# Patient Record
Sex: Male | Born: 1965 | Race: White | Hispanic: No | Marital: Married | Smoking: Never smoker
Health system: Southern US, Community
[De-identification: ages and names within clinical notes are randomized; demographics above are authoritative.]

## PROBLEM LIST (undated history)

## (undated) DIAGNOSIS — G35 Multiple sclerosis: Secondary | ICD-10-CM

## (undated) DIAGNOSIS — H539 Unspecified visual disturbance: Secondary | ICD-10-CM

## (undated) DIAGNOSIS — F32A Depression, unspecified: Secondary | ICD-10-CM

## (undated) DIAGNOSIS — R519 Headache, unspecified: Secondary | ICD-10-CM

## (undated) DIAGNOSIS — R51 Headache: Secondary | ICD-10-CM

## (undated) DIAGNOSIS — H9190 Unspecified hearing loss, unspecified ear: Secondary | ICD-10-CM

## (undated) DIAGNOSIS — R413 Other amnesia: Secondary | ICD-10-CM

## (undated) DIAGNOSIS — F329 Major depressive disorder, single episode, unspecified: Secondary | ICD-10-CM

## (undated) HISTORY — DX: Unspecified hearing loss, unspecified ear: H91.90

## (undated) HISTORY — DX: Unspecified visual disturbance: H53.9

## (undated) HISTORY — PX: OTHER SURGICAL HISTORY: SHX169

## (undated) HISTORY — DX: Other amnesia: R41.3

## (undated) HISTORY — DX: Headache, unspecified: R51.9

## (undated) HISTORY — DX: Headache: R51

---

## 2011-06-22 DIAGNOSIS — G35 Multiple sclerosis: Secondary | ICD-10-CM | POA: Insufficient documentation

## 2011-07-24 DIAGNOSIS — H43819 Vitreous degeneration, unspecified eye: Secondary | ICD-10-CM | POA: Insufficient documentation

## 2011-07-31 DIAGNOSIS — G43909 Migraine, unspecified, not intractable, without status migrainosus: Secondary | ICD-10-CM | POA: Insufficient documentation

## 2012-05-06 DIAGNOSIS — M25559 Pain in unspecified hip: Secondary | ICD-10-CM | POA: Insufficient documentation

## 2014-01-04 DIAGNOSIS — Z8042 Family history of malignant neoplasm of prostate: Secondary | ICD-10-CM | POA: Insufficient documentation

## 2014-01-09 DIAGNOSIS — K5900 Constipation, unspecified: Secondary | ICD-10-CM | POA: Insufficient documentation

## 2014-01-09 DIAGNOSIS — R3911 Hesitancy of micturition: Secondary | ICD-10-CM | POA: Insufficient documentation

## 2014-01-09 DIAGNOSIS — R350 Frequency of micturition: Secondary | ICD-10-CM | POA: Insufficient documentation

## 2014-03-14 DIAGNOSIS — N3281 Overactive bladder: Secondary | ICD-10-CM | POA: Insufficient documentation

## 2014-03-14 DIAGNOSIS — N3644 Muscular disorders of urethra: Secondary | ICD-10-CM | POA: Insufficient documentation

## 2015-04-12 DIAGNOSIS — G35 Multiple sclerosis: Secondary | ICD-10-CM | POA: Insufficient documentation

## 2015-04-12 DIAGNOSIS — F330 Major depressive disorder, recurrent, mild: Secondary | ICD-10-CM | POA: Insufficient documentation

## 2015-04-25 ENCOUNTER — Other Ambulatory Visit: Payer: Self-pay | Admitting: Neurology

## 2015-04-25 DIAGNOSIS — G35 Multiple sclerosis: Secondary | ICD-10-CM

## 2015-05-02 ENCOUNTER — Ambulatory Visit: Payer: 59

## 2015-05-02 ENCOUNTER — Ambulatory Visit
Admission: RE | Admit: 2015-05-02 | Discharge: 2015-05-02 | Disposition: A | Discharge status: Home / Self Care | Payer: 59 | Source: Ambulatory Visit | Attending: Neurology | Admitting: Neurology

## 2015-05-02 DIAGNOSIS — G35 Multiple sclerosis: Secondary | ICD-10-CM | POA: Diagnosis present

## 2015-05-02 DIAGNOSIS — M47892 Other spondylosis, cervical region: Secondary | ICD-10-CM | POA: Diagnosis not present

## 2015-05-02 DIAGNOSIS — M5032 Other cervical disc degeneration, mid-cervical region: Secondary | ICD-10-CM | POA: Diagnosis not present

## 2015-05-02 MED ORDER — GADOBENATE DIMEGLUMINE 529 MG/ML IV SOLN
20.0000 mL | Freq: Once | INTRAVENOUS | Status: AC | PRN
  Administered 2015-05-02: 20 mL via INTRAVENOUS

## 2015-06-28 ENCOUNTER — Encounter: Payer: Self-pay | Admitting: *Deleted

## 2015-07-01 ENCOUNTER — Encounter
Admission: RE | Disposition: A | Discharge status: Home / Self Care | Payer: Self-pay | Source: Ambulatory Visit | Attending: Gastroenterology

## 2015-07-01 ENCOUNTER — Encounter: Payer: Self-pay | Admitting: *Deleted

## 2015-07-01 ENCOUNTER — Ambulatory Visit
Admission: RE | Admit: 2015-07-01 | Discharge: 2015-07-01 | Disposition: A | Discharge status: Home / Self Care | Payer: 59 | Source: Ambulatory Visit | Attending: Gastroenterology | Admitting: Gastroenterology

## 2015-07-01 ENCOUNTER — Ambulatory Visit: Payer: 59 | Admitting: *Deleted

## 2015-07-01 DIAGNOSIS — K635 Polyp of colon: Secondary | ICD-10-CM | POA: Insufficient documentation

## 2015-07-01 DIAGNOSIS — Z79899 Other long term (current) drug therapy: Secondary | ICD-10-CM | POA: Diagnosis not present

## 2015-07-01 DIAGNOSIS — G35 Multiple sclerosis: Secondary | ICD-10-CM | POA: Diagnosis not present

## 2015-07-01 DIAGNOSIS — Z1211 Encounter for screening for malignant neoplasm of colon: Secondary | ICD-10-CM | POA: Insufficient documentation

## 2015-07-01 DIAGNOSIS — Z87891 Personal history of nicotine dependence: Secondary | ICD-10-CM | POA: Diagnosis not present

## 2015-07-01 DIAGNOSIS — Z882 Allergy status to sulfonamides status: Secondary | ICD-10-CM | POA: Diagnosis not present

## 2015-07-01 DIAGNOSIS — F329 Major depressive disorder, single episode, unspecified: Secondary | ICD-10-CM | POA: Diagnosis not present

## 2015-07-01 DIAGNOSIS — Z888 Allergy status to other drugs, medicaments and biological substances status: Secondary | ICD-10-CM | POA: Diagnosis not present

## 2015-07-01 DIAGNOSIS — Z880 Allergy status to penicillin: Secondary | ICD-10-CM | POA: Insufficient documentation

## 2015-07-01 HISTORY — DX: Major depressive disorder, single episode, unspecified: F32.9

## 2015-07-01 HISTORY — DX: Multiple sclerosis: G35

## 2015-07-01 HISTORY — PX: COLONOSCOPY WITH PROPOFOL: SHX5780

## 2015-07-01 HISTORY — DX: Depression, unspecified: F32.A

## 2015-07-01 SURGERY — COLONOSCOPY WITH PROPOFOL
Anesthesia: General

## 2015-07-01 MED ORDER — LACTATED RINGERS IV SOLN
INTRAVENOUS | Status: DC | PRN
  Administered 2015-07-01: 08:00:00 via INTRAVENOUS

## 2015-07-01 MED ORDER — SODIUM CHLORIDE 0.9 % IV SOLN
INTRAVENOUS | Status: DC
Start: 2015-07-01 — End: 2015-07-01
  Administered 2015-07-01: 1000 mL via INTRAVENOUS

## 2015-07-01 MED ORDER — MIDAZOLAM HCL 5 MG/5ML IJ SOLN
INTRAMUSCULAR | Status: DC | PRN
  Administered 2015-07-01: 2 mg via INTRAVENOUS

## 2015-07-01 MED ORDER — SODIUM CHLORIDE 0.9 % IV SOLN
INTRAVENOUS | Status: DC
Start: 2015-07-01 — End: 2015-07-01

## 2015-07-01 MED ORDER — FENTANYL CITRATE (PF) 100 MCG/2ML IJ SOLN
INTRAMUSCULAR | Status: DC | PRN
  Administered 2015-07-01: 50 ug via INTRAVENOUS

## 2015-07-01 MED ORDER — PROPOFOL 500 MG/50ML IV EMUL
INTRAVENOUS | Status: DC | PRN
  Administered 2015-07-01: 140 ug/kg/min via INTRAVENOUS

## 2015-07-01 MED ORDER — PROPOFOL 10 MG/ML IV BOLUS
INTRAVENOUS | Status: DC | PRN
  Administered 2015-07-01: 60 mg via INTRAVENOUS

## 2015-07-01 NOTE — Op Note (Signed)
[facility] Gastroenterology Patient Name: Parker [name] Procedure Date: 07/01/2015 7:36 AM MRN: [id number] Account #: [id number] Date of Birth: [birth year] Admit Type: Outpatient Age: 49 Room: [facility] ENDO ROOM 3 Gender: Male Note Status: Finalized Procedure:         Colonoscopy Indications:       Screening for colorectal malignant neoplasm, This is the                     patient's first colonoscopy Providers:         Christena Deem, MD Referring MD:      Haynes Kerns (Referring MD) Medicines:         Monitored Anesthesia Care Complications:     No immediate complications. Procedure:         Pre-Anesthesia Assessment:                    - ASA Grade Assessment: II - A patient with mild systemic                     disease.                    After obtaining informed consent, the colonoscope was                     passed under direct vision. Throughout the procedure, the                     patient's blood pressure, pulse, and oxygen saturations                     were monitored continuously. The Olympus PCF-H180AL                     colonoscope ( S#: [id number] ) was introduced through the                     anus and advanced to the the cecum, identified by                     appendiceal orifice and ileocecal valve. The quality of                     the bowel preparation was good. Findings:      A 3 mm polyp was found in the cecum. The polyp was sessile. The polyp       was removed with a cold biopsy forceps. Resection and retrieval were       complete.      Two sessile polyps were found in the recto-sigmoid colon. The polyps       were 2 to 3 mm in size. These polyps were removed with a cold biopsy       forceps. Resection and retrieval were complete.      The retroflexed view of the distal rectum and anal verge was normal and       showed no anal or rectal abnormalities.      The exam was otherwise normal throughout the examined colon. Impression:         - One 3 mm polyp in the cecum. Resected and retrieved.                    - Two 2 to 3 mm polyps at the recto-sigmoid colon.  Resected and retrieved.                    - The distal rectum and anal verge are normal on                     retroflexion view. Recommendation:    - Await pathology results.                    - Telephone GI clinic for pathology results in 1 week. Procedure Code(s): --- Professional ---                    [phone], Colonoscopy, flexible; with biopsy, single or                     multiple Diagnosis Code(s): --- Professional ---                    V76.51, Special screening for malignant neoplasms of colon                    211.3, Benign neoplasm of colon                    211.4, Benign neoplasm of rectum and anal canal CPT copyright 2014 American Medical Association. All rights reserved. The codes documented in this report are preliminary and upon coder review may  be revised to meet current compliance requirements. Christena Deem, MD 07/01/2015 8:45:29 AM This report has been signed electronically. Number of Addenda: 0 Note Initiated On: 07/01/2015 7:36 AM Scope Withdrawal Time: 0 hours 11 minutes 16 seconds  Total Procedure Duration: 0 hours 18 minutes 4 seconds       [facility]

## 2015-07-01 NOTE — Anesthesia Postprocedure Evaluation (Signed)
  Anesthesia Post-op Note  Patient: Parker [name]  Procedure(s) Performed: Procedure(s): COLONOSCOPY WITH PROPOFOL (N/A)  Anesthesia type:General  Patient location: PACU  Post pain: Pain level controlled  Post assessment: Post-op Vital signs reviewed, Patient's Cardiovascular Status Stable, Respiratory Function Stable, Patent Airway and No signs of Nausea or vomiting  Post vital signs: Reviewed and stable  Last Vitals:  Filed Vitals:   07/01/15 0850  BP: 123/81  Pulse: 72  Temp: 36 C  Resp: 10    Level of consciousness: awake, alert  and patient cooperative  Complications: No apparent anesthesia complications

## 2015-07-01 NOTE — Anesthesia Procedure Notes (Signed)
Date/Time: 07/01/2015 8:05 AM Performed by: Henrietta Hoover Pre-anesthesia Checklist: Patient identified, Emergency Drugs available, Suction available, Patient being monitored and Timeout performed Oxygen Delivery Method: Nasal cannula

## 2015-07-01 NOTE — Anesthesia Preprocedure Evaluation (Addendum)
Anesthesia Evaluation  Patient identified by MRN, date of birth, ID band Patient awake    Reviewed: Allergy & Precautions, NPO status , Patient's Chart, lab work & pertinent test results  Airway Mallampati: I  TM Distance: >3 FB Neck ROM: Full    Dental  (+) Teeth Intact   Pulmonary former smoker,  Occasional cigarette--considers himself a non-smoker.   Pulmonary exam normal        Cardiovascular negative cardio ROS Normal cardiovascular exam     Neuro/Psych    GI/Hepatic negative GI ROS,   Endo/Other    Renal/GU      Musculoskeletal   Abdominal (+)  Abdomen: soft.    Peds negative pediatric ROS (+)  Hematology   Anesthesia Other Findings   Reproductive/Obstetrics                             Anesthesia Physical Anesthesia Plan  ASA: II  Anesthesia Plan: General   Post-op Pain Management:    Induction: Intravenous  Airway Management Planned: Nasal Cannula  Additional Equipment:   Intra-op Plan:   Post-operative Plan:   Informed Consent: I have reviewed the patients History and Physical, chart, labs and discussed the procedure including the risks, benefits and alternatives for the proposed anesthesia with the patient or authorized representative who has indicated his/her understanding and acceptance.     Plan Discussed with: CRNA  Anesthesia Plan Comments:         Anesthesia Quick Evaluation

## 2015-07-01 NOTE — Transfer of Care (Signed)
Immediate Anesthesia Transfer of Care Note  Patient: Parker [name]  Procedure(s) Performed: Procedure(s): COLONOSCOPY WITH PROPOFOL (N/A)  Patient Location: PACU  Anesthesia Type:General  Level of Consciousness: sedated  Airway & Oxygen Therapy: Patient Spontanous Breathing and Patient connected to nasal cannula oxygen  Post-op Assessment: Report given to RN and Post -op Vital signs reviewed and stable  Post vital signs: Reviewed and stable  Last Vitals:  Filed Vitals:   07/01/15 0850  BP: 123/81  Pulse: 72  Temp: 36 C  Resp: 10    Complications: No apparent anesthesia complications

## 2015-07-01 NOTE — H&P (Signed)
Outpatient short stay form Pre-procedure 07/01/2015 8:13 AM Christena Deem MD   Primary Physician: Dr Vira Blanco  Reason for visit:  Screening colonosocpy  History of present illness:  Patient is a 49 yo male presenting for screening colonoscopy.  He tolerated his prep well.  He stopped  81 mg asa several days ago.     Current facility-administered medications:  .  0.9 %  sodium chloride infusion, , Intravenous, Continuous, Christena Deem, MD, Last Rate: 20 mL/hr at 07/01/15 0749, 1,000 mL at 07/01/15 0749 .  0.9 %  sodium chloride infusion, , Intravenous, Continuous, Christena Deem, MD  Prescriptions prior to admission  Medication Sig Dispense Refill Last Dose  . baclofen (LIORESAL) 10 MG tablet Take 10 mg by mouth 3 (three) times daily.   06/30/2015 at Unknown time  . DULoxetine (CYMBALTA) 20 MG capsule Take 20 mg by mouth daily.   06/30/2015 at Unknown time  . Fingolimod HCl 0.5 MG CAPS Take 0.5 mg by mouth daily.   06/30/2015 at 800nknown time  . pregabalin (LYRICA) 100 MG capsule Take 100 mg by mouth 2 (two) times daily.   06/30/2015 at Unknown time     Allergies  Allergen Reactions  . Glucosamine Forte [Nutritional Supplements]   . Sulfa Antibiotics   . Sulfur Other (See Comments)    Severe Headaches  . Tapentadol Itching  . Clonazepam Rash  . Imipramine Rash  . Penicillins Rash     Past Medical History  Diagnosis Date  . Multiple sclerosis   . Depression     Review of systems:      Physical Exam    Heart and lungs: Regular rate and rhythm, lungs are bilaterally clear    HEENT: Normocephalic atraumatic eyes are anicteric    Other:     Pertinant exam for procedure: Soft nontender nondistended bowel sounds positive normoactive    Planned proceedures: Colonoscopy and indicated procedures. I have discussed the risks benefits and complications of procedures to include not limited to bleeding, infection, perforation and the risk of sedation and the patient  wishes to proceed.    Christena Deem, MD Gastroenterology 07/01/2015  8:13 AM

## 2015-07-02 ENCOUNTER — Encounter: Payer: Self-pay | Admitting: Gastroenterology

## 2015-07-02 LAB — SURGICAL PATHOLOGY

## 2015-07-07 DIAGNOSIS — G43019 Migraine without aura, intractable, without status migrainosus: Secondary | ICD-10-CM | POA: Insufficient documentation

## 2015-07-08 DIAGNOSIS — E782 Mixed hyperlipidemia: Secondary | ICD-10-CM | POA: Insufficient documentation

## 2015-07-08 DIAGNOSIS — R079 Chest pain, unspecified: Secondary | ICD-10-CM | POA: Insufficient documentation

## 2015-10-11 ENCOUNTER — Ambulatory Visit (INDEPENDENT_AMBULATORY_CARE_PROVIDER_SITE_OTHER): Payer: BLUE CROSS/BLUE SHIELD | Admitting: Neurology

## 2015-10-11 ENCOUNTER — Encounter: Payer: Self-pay | Admitting: Neurology

## 2015-10-11 VITALS — BP 110/84 | HR 66 | Resp 16 | Ht 71.0 in | Wt 284.8 lb

## 2015-10-11 DIAGNOSIS — G35 Multiple sclerosis: Secondary | ICD-10-CM

## 2015-10-11 DIAGNOSIS — R5383 Other fatigue: Secondary | ICD-10-CM | POA: Insufficient documentation

## 2015-10-11 DIAGNOSIS — F329 Major depressive disorder, single episode, unspecified: Secondary | ICD-10-CM

## 2015-10-11 DIAGNOSIS — M545 Low back pain, unspecified: Secondary | ICD-10-CM

## 2015-10-11 DIAGNOSIS — R3911 Hesitancy of micturition: Secondary | ICD-10-CM

## 2015-10-11 DIAGNOSIS — R269 Unspecified abnormalities of gait and mobility: Secondary | ICD-10-CM

## 2015-10-11 DIAGNOSIS — M549 Dorsalgia, unspecified: Secondary | ICD-10-CM | POA: Insufficient documentation

## 2015-10-11 DIAGNOSIS — F419 Anxiety disorder, unspecified: Secondary | ICD-10-CM | POA: Diagnosis not present

## 2015-10-11 DIAGNOSIS — F32A Depression, unspecified: Secondary | ICD-10-CM

## 2015-10-11 DIAGNOSIS — H539 Unspecified visual disturbance: Secondary | ICD-10-CM | POA: Diagnosis not present

## 2015-10-11 MED ORDER — TAMSULOSIN HCL 0.4 MG PO CAPS
0.4000 mg | ORAL_CAPSULE | Freq: Every day | ORAL | Status: DC
Start: 2015-10-11 — End: 2016-10-21

## 2015-10-11 MED ORDER — BACLOFEN 20 MG PO TABS: 20.0000 mg | ORAL_TABLET | Freq: Three times a day (TID) | ORAL | Status: DC

## 2015-10-11 MED ORDER — HYDROCODONE-ACETAMINOPHEN 5-325 MG PO TABS: 1.0000 | ORAL_TABLET | Freq: Three times a day (TID) | ORAL | Status: DC | PRN

## 2015-10-11 NOTE — Progress Notes (Signed)
[facility]  PATIENT: Parker [name] DOB: 1966/07/01  REFERRING DOCTOR OR PCP:  Dr. Cristopher Peru (fax  [phone]) SOURCE: patient, notes, MRI reports and MRI images on PACS  _________________________________   HISTORICAL  CHIEF COMPLAINT:  No chief complaint on file.   HISTORY OF PRESENT ILLNESS:  I had the pleasure of seeing your patient, Parker [name], at [facility] for neurologic consultation. He is a 50 year old man with multiple sclerosis.  Currently, he is on [place]. He tolerates it well but has only had about 3 doses. He reports that his liver function tests have been fine. He has multiple symptoms, some related to his MS.  MS History:   He presented in 2003 with numbness that started in the left foot and then progress to the entire left side over a period of about a month. He was initially worked up but there was not enough evidence to conclusively call him MS. He was referred to [facility] clinic and saw Dr. Caryn Section. 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 2008, he went on Tysabri and stayed on Tysabri for about 4 or 5 years. He tolerated it very well and felt that he was stable while he was on it. 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 He switched to [place]. He more recently moved to this area. Dr. Sherryll Burger retested him and he is JCV Ab high posititve and placed him on [name].     Gait/strength/sensation:    He has had difficulty with his gait and reports a poor balance. He notes some weakness in the limbs. He reports a lot of numbness still in the left side of his body with painful dysesthesias. The dysesthesias are worse in cold weather.  He is on Ampyra x 2 months but is not sure where the not he is getting a benefit in walking.  Bladder: He reports urinary  hesitancy. He was once placed on a medication but it did not help.  Vision: He reports some intermittent blurry vision. He wears glasses.  Fatigue/Sleep:      He reports a lot of of physical and cognitive fatigue. He notes only mild changes and fatigue with heat. He has had some insomnia. Some nights he sleeps well but others he will have a lot of difficulties maintaining sleep.  Mood/cognition: He reports depression and anxiety and has had panic attacks. The panic attacks can be worse at night. He also has had difficulties with cognition. He notes difficulties with short-term more than long-term memory. There is decreased focus. He has verbal fluency issues.  Migraine: He reports a daily "ice pick" headache in the right side of his head, near the eye. In the past, Botox was very effective in controlling these have headaches. He did not benefit as much from other preventative medicines.  Chronic pain:   He reports wide spread pain in his back and limbs, superimposed on the left dysesthetic pain and his headaches.     I personally reviewed the MRI of the brain dated 05/02/2015. It shows multiple white matter lesions, many of them in the periventricular white matter, some radially oriented to the ventricles. There is a focus within the right cerebellar hemisphere, as well. There are no enhancing lesions. The MRI of the cervical spine from the same day shows a normal spinal cord. At C6-C7 there  is L > R foraminal narrowing due to degenerative changes  with some encroachment on the left C7 nerve root.  REVIEW OF SYSTEMS: Constitutional: No fevers, chills, sweats, or change in appetite.   He reports  fatigue and poor sleep. Eyes: No visual changes, double vision, eye pain Ear, nose and throat: No hearing loss, ear pain, nasal congestion, sore throat Cardiovascular: No chest pain, palpitations Respiratory: No shortness of breath at rest or with exertion.   No wheezes GastrointestinaI: No nausea,  vomiting, diarrhea, abdominal pain, fecal incontinence Genitourinary: as above. Musculoskeletal.    he reports pain in the neck, back and many of his muscles. Integumentary: No rash, pruritus, skin lesions Neurological: as above Psychiatric: No depression at this time.  No anxiety Endocrine: No palpitations, diaphoresis, change in appetite, change in weigh or increased thirst Hematologic/Lymphatic: No anemia, purpura, petechiae. Allergic/Immunologic: No itchy/runny eyes, nasal congestion, recent allergic reactions, rashes  ALLERGIES: Allergies  Allergen Reactions  . Glucosamine Forte [Nutritional Supplements]   . Sulfa Antibiotics   . Sulfur Other (See Comments)    Severe Headaches  . Tapentadol Itching  . Clonazepam Rash  . Imipramine Rash  . Penicillins Rash    HOME MEDICATIONS:  Current outpatient prescriptions:  .  baclofen (LIORESAL) 10 MG tablet, Take 10 mg by mouth 3 (three) times daily., Disp: , Rfl:  .  Daclizumab ([name]) 150 MG/ML SOSY, Inject into the skin., Disp: , Rfl:  .  dalfampridine (AMPYRA) 10 MG TB12, Take by mouth., Disp: , Rfl:  .  DULoxetine (CYMBALTA) 20 MG capsule, Take 20 mg by mouth daily., Disp: , Rfl:  .  pregabalin (LYRICA) 100 MG capsule, Take 100 mg by mouth 2 (two) times daily., Disp: , Rfl:  .  Fingolimod HCl 0.5 MG CAPS, Take 0.5 mg by mouth daily. Reported on 10/11/2015, Disp: , Rfl:   PAST MEDICAL HISTORY: Past Medical History  Diagnosis Date  . Multiple sclerosis (HCC)   . Depression   . Vision abnormalities   . Headache   . Hearing loss   . Memory loss     PAST SURGICAL HISTORY: Past Surgical History  Procedure Laterality Date  . Varicose seals    . Colonoscopy with propofol N/A 07/01/2015    Procedure: COLONOSCOPY WITH PROPOFOL;  Surgeon: Christena Deem, MD;  Location: [facility] ENDOSCOPY;  Service: Endoscopy;  Laterality: N/A;    FAMILY HISTORY: Family History  Problem Relation Age of Onset  . Lung cancer Mother   . Heart  disease Father   . Prostate cancer Father   . Diabetes type II Father     SOCIAL HISTORY:  Social History   Social History  . Marital Status: Married    Spouse Name: N/A  . Number of Children: N/A  . Years of Education: N/A   Occupational History  . Not on file.   Social History Main Topics  . Smoking status: Never Smoker   . Smokeless tobacco: Never Used  . Alcohol Use: No  . Drug Use: No  . Sexual Activity: Not on file   Other Topics Concern  . Not on file   Social History Narrative     PHYSICAL EXAM  There were no vitals filed for this visit.  There is no weight on file to calculate BMI.   General: The patient is well-developed and well-nourished and in no acute distress  Eyes:  Funduscopic exam shows normal optic discs and retinal vessels.  Neck: The neck is supple, no carotid bruits  are noted.  The neck is mildly tender at the occiput in the paraspinal muscles..  Cardiovascular: The heart has a regular rate and rhythm with a normal S1 and S2. There were no murmurs, gallops or rubs. Lungs are clear to auscultation.  Skin: Extremities are without significant edema.  Musculoskeletal:  Back is mildly tender. Trochanteric bursae are mildly tender.  Neurologic Exam  Mental status: The patient is alert and oriented x 3 at the time of the examination. The patient has apparent normal recent and remote memory, with an apparently normal attention span and concentration ability.   Speech is normal.  Cranial nerves: Extraocular movements are full. Pupils are equal, round, and reactive to light and accomodation.  Visual fields are full.  Facial symmetry is present. He reports decreased facial sensation on the left. Facial strength was symmetric. Trapezius and sternocleidomastoid strength is normal. No dysarthria is noted.  The tongue is midline, and the patient has symmetric elevation of the soft palate. No obvious hearing deficits are noted.  Motor:  Muscle bulk is  normal.   Tone is increased in legs. Strength is  5 / 5 in arms and proximally in legs. Strength was 4+ over 5 distally in the legs  Sensory:   On sensory testing, he reports decreased sensation to touch and temperature in the left arm and leg. He reports decreased sensation and vibration in the right arm and leg.   Coordination: Cerebellar testing reveals good finger-nose-finger and reduced heel-to-shin bilaterally.  Gait and station: Station is normal. Gait is wide-based and mildly spastic. Wideg is negative.   Reflexes: Deep tendon reflexes are symmetric and increased in legs bilaterally.   Plantar responses are flexor.  25 foot timed walk:    9.6 s  (average of two)    DIAGNOSTIC DATA (LABS, IMAGING, TESTING) - I reviewed patient records, labs, notes, testing and imaging myself where available.      ASSESSMENT AND PLAN  Relapsing remitting multiple sclerosis (HCC)  Gait disturbance  Visual disturbance  Midline low back pain without sciatica  Other fatigue  Clinical depression  Anxiety  Delayed onset of urination   In summary, Parker [name] is a 50 year old man with multiple sclerosis. I believe his course is most consistent with secondary progressive with relapses. Therefore, he needs to continue to be on a disease modifying therapy. He currently is stable on [name]. He felt best when he was on Tysabri but he is JCV antibody positive, apparently with a high titer. For the time being, he wants to continue on [name]. We briefly went over available therapies and those that may be available soon. He has an interest in considering ocrelizumab when it becomes available.     He reports a lot of symptoms and his depression and poor sleep may be making it worse. I'll see we can help the bladder by adding tamsulosin. He will continue Lyrica and baclofen. I'll write for hydrocodone up to 3 times a day. However, if he continues to have a lot of pain he might go to pain management  clinic. He is not sure whether he is getting a benefit from the Ampyra. He will go off or couple weeks and restart and see if he can tell where the not there is a difference. If he does not think he is getting a benefit, I recommended he stop the medicine.   He will return to see me in 3 or 4  months or sooner if there are new or worsening  neurologic symptoms.   Thank you for asking me to see Mr. Hissong. Please let me know if I can be of further assistance with her or other patients in the future.      Richard A. Epimenio Foot, MD, PhD 10/11/2015, 10:07 AM Certified in Neurology, Clinical Neurophysiology, Sleep Medicine, Pain Medicine and Neuroimaging  [facility] [address], [address] [place], [place] [address] [phone]

## 2015-11-29 ENCOUNTER — Other Ambulatory Visit: Payer: Self-pay | Admitting: *Deleted

## 2015-11-29 MED ORDER — DACLIZUMAB 150 MG/ML ~~LOC~~ SOSY: 150.0000 mg | PREFILLED_SYRINGE | SUBCUTANEOUS | Status: DC

## 2015-11-29 NOTE — Telephone Encounter (Signed)
[name] rx. faxed to [facility] per faxed request/fim

## 2016-01-09 ENCOUNTER — Encounter: Payer: Self-pay | Admitting: Neurology

## 2016-01-09 ENCOUNTER — Ambulatory Visit (INDEPENDENT_AMBULATORY_CARE_PROVIDER_SITE_OTHER): Payer: BC Managed Care – PPO | Admitting: Neurology

## 2016-01-09 ENCOUNTER — Ambulatory Visit: Payer: BLUE CROSS/BLUE SHIELD | Admitting: Neurology

## 2016-01-09 VITALS — BP 124/86 | HR 80 | Resp 18 | Ht 71.0 in | Wt 270.5 lb

## 2016-01-09 DIAGNOSIS — G43019 Migraine without aura, intractable, without status migrainosus: Secondary | ICD-10-CM | POA: Diagnosis not present

## 2016-01-09 DIAGNOSIS — R41840 Attention and concentration deficit: Secondary | ICD-10-CM

## 2016-01-09 DIAGNOSIS — N3644 Muscular disorders of urethra: Secondary | ICD-10-CM

## 2016-01-09 DIAGNOSIS — G35 Multiple sclerosis: Secondary | ICD-10-CM | POA: Diagnosis not present

## 2016-01-09 DIAGNOSIS — F32A Depression, unspecified: Secondary | ICD-10-CM

## 2016-01-09 DIAGNOSIS — R269 Unspecified abnormalities of gait and mobility: Secondary | ICD-10-CM

## 2016-01-09 DIAGNOSIS — R5383 Other fatigue: Secondary | ICD-10-CM | POA: Diagnosis not present

## 2016-01-09 DIAGNOSIS — F329 Major depressive disorder, single episode, unspecified: Secondary | ICD-10-CM | POA: Diagnosis not present

## 2016-01-09 MED ORDER — AMANTADINE HCL 100 MG PO CAPS: 100.0000 mg | ORAL_CAPSULE | Freq: Two times a day (BID) | ORAL | Status: DC

## 2016-01-09 NOTE — Progress Notes (Signed)
[facility]  PATIENT: Parker [name] DOB: 02-17-1966  REFERRING DOCTOR OR PCP:  Dr. Cristopher Peru (fax  [phone]) SOURCE: patient, notes, MRI reports and MRI images on PACS  _________________________________   HISTORICAL  CHIEF COMPLAINT:  Chief Complaint  Patient presents with  . Multiple Sclerosis    Sts. he continues to tolerate [name] well.  He denies new or worsening sx.  Sts. a stomach bug is making it's way thru his family--today he has some nausea without vomiting.  No fever./fim    HISTORY OF PRESENT ILLNESS:  I had the pleasure of seeing your patient, Parker [name], at [facility] for neurologic consultation. He is a 50 year old man with multiple sclerosis.  Currently, he is on [place]. He tolerates it well.    LFT's 11/25/15 were normal.   No skin changes.  GI:    He had a 'stomach bug' last week (entire family was sick at one point or another over past 2 weeks).   He is currently nauseous.     Gait/strength/sensation:    He has had some variability in balance recently when he was sick.    Even when he feels good, he has mild balance difficulty.   He notes some weakness in the limbs. He reports a lot of numbness still in the left side of his body with painful dysesthesias.   Lyrica has helped a lot. The dysesthesias are worse in cold weather.  He stopped Ampyra as he is was not getting a benefit in walking.  Bladder: He reports urinary hesitancy is better since starting tamsulosin.    Has had GI issues last week -- better today but still nauseous.  Vision: He reports some intermittent blurry vision. He wears glasses.  Fatigue/Sleep:      He reports more physical and cognitive fatigue.He has never troied any medication for this.   Fatigue is worse later in the day.  Heat does not alter much.  . He has had some insomnia. Some nights he sleeps well but others he will have a lot of difficulties maintaining sleep.  Mood: He reports  depression and anxiety and has had panic attacks. The panic attacks can be worse at night.    Cognition:   He also has had difficulties with cognition. He notes difficulties with short-term more than long-term memory. There is decreased focus and attention. He has verbal fluency issues.  Migraine: He reports  "ice pick" headaches in the right side of his head almost every day. In the past, Botox was very effective in controlling these headaches in the past (He is seeing Dr. Sherryll Burger for the Migraines -- they are trying to get re-auth'd)). He did not benefit as much from other preventative medicines.  MS History:   He presented in 2003 with numbness that started in the left foot and then progress to the entire left side over a period of about a month. He was initially worked up but there was not enough evidence to conclusively call him MS. He was referred to [facility] clinic and saw Dr. Caryn Section. 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 2008, he went on Tysabri and stayed on Tysabri for about 4 or 5 years. He tolerated it very well and felt that he was stable while he was on it. 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  He switched to Gilenya. He more recently moved to this area. Dr. Sherryll Burger retested him and he is JCV Ab high posititve and placed him on [name].     Data personally reviewed the MRI of the brain dated 05/02/2015. It shows multiple white matter lesions, many of them in the periventricular white matter, some radially oriented to the ventricles. There is a focus within the right cerebellar hemisphere, as well. There are no enhancing lesions. The MRI of the cervical spine from the same day shows a normal spinal cord. At C6-C7 there is L > R foraminal narrowing due to degenerative changes  with some encroachment on the left C7  nerve root.  REVIEW OF SYSTEMS: Constitutional: No fevers, chills, sweats, or change in appetite.   He reports  fatigue and poor sleep. Eyes: No visual changes, double vision, eye pain Ear, nose and throat: No hearing loss, ear pain, nasal congestion, sore throat Cardiovascular: No chest pain, palpitations Respiratory: No shortness of breath at rest or with exertion.   No wheezes GastrointestinaI: No nausea, vomiting, diarrhea, abdominal pain, fecal incontinence Genitourinary: as above. Musculoskeletal.    he reports pain in the neck, back and many of his muscles. Integumentary: No rash, pruritus, skin lesions Neurological: as above Psychiatric: No depression at this time.  No anxiety Endocrine: No palpitations, diaphoresis, change in appetite, change in weigh or increased thirst Hematologic/Lymphatic: No anemia, purpura, petechiae. Allergic/Immunologic: No itchy/runny eyes, nasal congestion, recent allergic reactions, rashes  ALLERGIES: Allergies  Allergen Reactions  . Glucosamine Forte [Nutritional Supplements]   . Sulfa Antibiotics   . Sulfur Other (See Comments)    Severe Headaches  . Tapentadol Itching  . Clonazepam Rash  . Imipramine Rash  . Penicillins Rash    HOME MEDICATIONS:  Current outpatient prescriptions:  .  baclofen (LIORESAL) 20 MG tablet, Take 1 tablet (20 mg total) by mouth 3 (three) times daily., Disp: 90 tablet, Rfl: 11 .  Daclizumab ([name]) 150 MG/ML SOSY, Inject 150 mg into the skin every 30 (thirty) days., Disp: 1 Syringe, Rfl: 11 .  DULoxetine (CYMBALTA) 20 MG capsule, Take 20 mg by mouth daily., Disp: , Rfl:  .  HYDROcodone-acetaminophen (NORCO/VICODIN) 5-325 MG tablet, Take 1 tablet by mouth every 8 (eight) hours as needed for moderate pain., Disp: 90 tablet, Rfl: 0 .  pregabalin (LYRICA) 100 MG capsule, Take 100 mg by mouth 2 (two) times daily., Disp: , Rfl:  .  tamsulosin (FLOMAX) 0.4 MG CAPS capsule, Take 1 capsule (0.4 mg total) by mouth  daily., Disp: 30 capsule, Rfl: 11  PAST MEDICAL HISTORY: Past Medical History  Diagnosis Date  . Multiple sclerosis (HCC)   . Depression   . Vision abnormalities   . Headache   . Hearing loss   . Memory loss     PAST SURGICAL HISTORY: Past Surgical History  Procedure Laterality Date  . Varicose seals    . Colonoscopy with propofol N/A 07/01/2015    Procedure: COLONOSCOPY WITH PROPOFOL;  Surgeon: Christena Deem, MD;  Location: [facility] ENDOSCOPY;  Service: Endoscopy;  Laterality: N/A;    FAMILY HISTORY: Family History  Problem Relation Age of Onset  . Lung cancer Mother   . Heart disease Father   . Prostate cancer Father   . Diabetes type II Father     SOCIAL HISTORY:  Social History   Social History  . Marital Status: Married    Spouse Name: N/A  . Number of Children: N/A  . Years of Education: N/A  Occupational History  . Not on file.   Social History Main Topics  . Smoking status: Never Smoker   . Smokeless tobacco: Never Used  . Alcohol Use: No  . Drug Use: No  . Sexual Activity: Not on file   Other Topics Concern  . Not on file   Social History Narrative     PHYSICAL EXAM  Filed Vitals:   01/09/16 0920  BP: 124/86  Pulse: 80  Resp: 18  Height:  (1.803 m)  Weight: 270 lb 8 oz (122.698 kg)    Body mass index is 37.74 kg/(m^2).   General: The patient is well-developed and well-nourished and in no acute distress   Skin: Extremities are without significant edema or rash   Neurologic Exam  Mental status: The patient is alert and oriented x 3 at the time of the examination. The patient has apparent normal recent and remote memory, with mildly reduced attention span and concentration ability.   Speech is normal.  Cranial nerves: Extraocular movements are full. He reports decreased facial sensation on the left. Facial strength was symmetric. Trapezius and sternocleidomastoid strength is normal. No dysarthria is noted.  The tongue is  midline, and the patient has symmetric elevation of the soft palate. No obvious hearing deficits are noted.  Motor:  Muscle bulk is normal.   Tone is increased in legs. Strength is  5 / 5 in arms and proximally in legs. Strength was 4+/5 distally in the legs  Sensory:   On sensory testing, he reports decreased sensation to touch and temperature in the left arm and leg.    Coordination: Cerebellar testing reveals good finger-nose-finger and reduced heel-to-shin bilaterally.  Gait and station: Station is normal. Gait is wide-based and mildly spastic.  Romberg is negative.   Reflexes: Deep tendon reflexes are symmetric and increased in legs bilaterally.      25 foot timed walk:    9.8 s  (average of two)    DIAGNOSTIC DATA (LABS, IMAGING, TESTING) - I reviewed patient records, labs, notes, testing and imaging myself where available.      ASSESSMENT AND PLAN  Multiple sclerosis (HCC)  Bladder sphincter dyssynergia  Gait disturbance  Clinical depression  Other fatigue  Common migraine with intractable migraine  Attention deficit    1.   Continue [name] for MS. He seems to be tolerating it well. LFTs were fine recently. 2.   Continue tamsulosin for urinary hesitancy. 3.   Trial of amantadine for fatigue. Consider a stimulant as he has cognitive fatigue as well as difficulty with focus and attention. 4.   Continue to exercise. He has lost some weight on his diet (and probably with his recent illness) and hopes to lose more.  5.  He will return to see me in 4 months or sooner if there are new or worsening neurologic symptoms.    Richard A. Epimenio Foot, MD, PhD 01/09/2016, 9:35 AM Certified in Neurology, Clinical Neurophysiology, Sleep Medicine, Pain Medicine and Neuroimaging  [facility] [address], [address] [place], [place] [address] [phone]

## 2016-02-10 ENCOUNTER — Encounter: Payer: Self-pay | Admitting: *Deleted

## 2016-03-23 ENCOUNTER — Telehealth: Payer: Self-pay | Admitting: Neurology

## 2016-03-23 NOTE — Telephone Encounter (Signed)
LMOM for Parker [name] to call/fim 

## 2016-03-23 NOTE — Telephone Encounter (Signed)
Thayer Ohm, zinbryta lab assist program called about pts monthly lab order. Please call [phone] ext: [phone]

## 2016-03-24 ENCOUNTER — Telehealth: Payer: Self-pay | Admitting: Neurology

## 2016-03-24 MED ORDER — PREGABALIN 100 MG PO CAPS: 100.0000 mg | ORAL_CAPSULE | Freq: Two times a day (BID) | ORAL | Status: DC

## 2016-03-24 NOTE — Telephone Encounter (Signed)
LMOM for Thayer Ohm to call/fim

## 2016-03-24 NOTE — Telephone Encounter (Signed)
Rx. awaiting RAS sig/fim 

## 2016-03-24 NOTE — Telephone Encounter (Signed)
Thayer Ohm returned call.

## 2016-03-24 NOTE — Telephone Encounter (Signed)
Thayer Ohm with [employer] returned call- [phone] ext: [phone]

## 2016-03-24 NOTE — Telephone Encounter (Signed)
Patient requesting refill of pregabalin (LYRICA) 100 MG capsule  Pharmacy: [facility] [place]

## 2016-03-24 NOTE — Telephone Encounter (Signed)
LMOM for Parker [name] to call/fim 

## 2016-03-24 NOTE — Telephone Encounter (Signed)
Rx. faxed to [facility] in [place] as requested/fim

## 2016-04-23 ENCOUNTER — Other Ambulatory Visit: Payer: Self-pay

## 2016-05-05 LAB — BILIRUBIN, TOTAL: Bilirubin Total: 0.5 mg/dL (ref 0.0–1.2)

## 2016-05-05 LAB — ALT: ALT: 10 IU/L (ref 0–44)

## 2016-05-05 LAB — AST: AST: 13 IU/L (ref 0–40)

## 2016-05-12 ENCOUNTER — Encounter: Payer: Self-pay | Admitting: Neurology

## 2016-05-12 ENCOUNTER — Ambulatory Visit (INDEPENDENT_AMBULATORY_CARE_PROVIDER_SITE_OTHER): Payer: BLUE CROSS/BLUE SHIELD | Admitting: Neurology

## 2016-05-12 VITALS — BP 116/80 | HR 68 | Resp 4 | Ht 71.0 in | Wt 254.0 lb

## 2016-05-12 DIAGNOSIS — G35 Multiple sclerosis: Secondary | ICD-10-CM

## 2016-05-12 DIAGNOSIS — R41840 Attention and concentration deficit: Secondary | ICD-10-CM | POA: Diagnosis not present

## 2016-05-12 DIAGNOSIS — R5383 Other fatigue: Secondary | ICD-10-CM

## 2016-05-12 DIAGNOSIS — G43709 Chronic migraine without aura, not intractable, without status migrainosus: Secondary | ICD-10-CM

## 2016-05-12 DIAGNOSIS — G43909 Migraine, unspecified, not intractable, without status migrainosus: Secondary | ICD-10-CM

## 2016-05-12 DIAGNOSIS — N3644 Muscular disorders of urethra: Secondary | ICD-10-CM

## 2016-05-12 NOTE — Progress Notes (Signed)
[facility]  PATIENT: Parker [name] DOB: 31-Dec-1965  REFERRING DOCTOR OR PCP:  Dr. Cristopher Peru (fax  [phone]) SOURCE: patient, notes, MRI reports and MRI images on PACS  _________________________________   HISTORICAL  CHIEF COMPLAINT:  Chief Complaint  Patient presents with  . Multiple Sclerosis    Sts. he continues to tolerate [name] well.  Sts. Amantadine does not help fatigue.  Still c/o more forgetfulness, poor focus.  Feels he has more generalized weakness/fim    HISTORY OF PRESENT ILLNESS:  Parker [name] is a 50 year old man with multiple sclerosis and chronic migraine   MS:   He has been on [name] 8-9 months and he tolerates it well.    LFT's have been normal.   No skin changes.   No new exacerbations.   Mst symptoms are stable  Migraine: He reports migraine headaches.   Pain is throbbing and more often on the right side of his head.    Pain is now daily (30/30 days for > 4 hours a day). When pain is more severe he will get nausea, photophobia and phonophobia. In the past, Botox was very effective in controlling these headaches.   He did not benefit as much from other preventative medicines.    He has tried and failed antiepileptic (Topiramate, Depakote IV), NSAIDs (various), opiates (various), Tricyclics (amitriptyline, nortriptyline), muscle relaxants (tizanidine, baclofen).  Triptsans (severla different) have not helped relieve the headaches.    Gait/strength/sensation:    Gait is unsteady at times with some stumbling.   A couple falls led to bruising but no severer falls.   He feels balance is more of an issue than strength.. He reports numbness in the left side of his body with painful dysesthesias, helped by Lyrica. The dysesthesias are worse in cold weather.  He stopped Ampyra as he is was not getting a benefit in walking.  Bladder: He reports urinary hesitancy is better since starting tamsulosin and he has less nocturia.    He has had  constipation.  Vision: He reports some intermittent blurry vision. He wears glasses.  No diplopia.  Fatigue/Sleep:  He reports fatigue that is both physical and cognitive. Fatigue is worse later in the day.  Heat does not alter much.  Amantadine has not helped much. He was once tried on Ritalin but that made him irritable and he stopped.. He has had some insomnia. Some nights he sleeps well but others he will have a lot of difficulties maintaining sleep.  Mood: He reports depression and anxiety and has had panic attacks. The panic attacks can be worse at night.    Cognition:   He has difficulties with cognition. He notes difficulties with short-term more than long-term memory. There is decreased focus and attention. He has verbal fluency issues.  MS History:   He presented in 2003 with numbness that started in the left foot and then progress to the entire left side over a period of about a month. He was initially worked up but there was not enough evidence to conclusively call him MS. He was referred to [facility] clinic and saw Dr. Caryn Section. 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 2008, he went on Tysabri and stayed on Tysabri for about 4 or 5 years. He tolerated it very well and felt that he was stable while he was on it.  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 He switched to Gilenya. He more recently moved to this area. Dr. Sherryll Burger retested him and he is JCV Ab high posititve and placed him on [name].     Data personally reviewed the MRI of the brain dated 05/02/2015. It shows multiple white matter lesions, many of them in the periventricular white matter, some radially oriented to the ventricles. There is a focus within the right cerebellar hemisphere, as well. There are no enhancing lesions. The MRI of the cervical spine  from the same day shows a normal spinal cord. At C6-C7 there is L > R foraminal narrowing due to degenerative changes  with some encroachment on the left C7 nerve root.  REVIEW OF SYSTEMS: Constitutional: No fevers, chills, sweats, or change in appetite.   He reports  fatigue and poor sleep. Eyes: No visual changes, double vision, eye pain Ear, nose and throat: No hearing loss, ear pain, nasal congestion, sore throat Cardiovascular: No chest pain, palpitations Respiratory: No shortness of breath at rest or with exertion.   No wheezes GastrointestinaI: No nausea, vomiting, diarrhea, abdominal pain, fecal incontinence Genitourinary: as above. Musculoskeletal.    he reports pain in the neck, back and many of his muscles. Integumentary: No rash, pruritus, skin lesions Neurological: as above Psychiatric: No depression at this time.  No anxiety Endocrine: No palpitations, diaphoresis, change in appetite, change in weigh or increased thirst Hematologic/Lymphatic: No anemia, purpura, petechiae. Allergic/Immunologic: No itchy/runny eyes, nasal congestion, recent allergic reactions, rashes  ALLERGIES: Allergies  Allergen Reactions  . Glucosamine Forte [Nutritional Supplements]   . Sulfa Antibiotics   . Sulfur Other (See Comments)    Severe Headaches  . Tapentadol Itching  . Clonazepam Rash  . Imipramine Rash  . Penicillins Rash    HOME MEDICATIONS:  Current Outpatient Prescriptions:  .  amantadine (SYMMETREL) 100 MG capsule, Take 1 capsule (100 mg total) by mouth 2 (two) times daily., Disp: 60 capsule, Rfl: 5 .  baclofen (LIORESAL) 20 MG tablet, Take 1 tablet (20 mg total) by mouth 3 (three) times daily., Disp: 90 tablet, Rfl: 11 .  Daclizumab ([name]) 150 MG/ML SOSY, Inject 150 mg into the skin every 30 (thirty) days., Disp: 1 Syringe, Rfl: 11 .  DULoxetine (CYMBALTA) 20 MG capsule, Take 20 mg by mouth daily., Disp: , Rfl:  .  HYDROcodone-acetaminophen (NORCO/VICODIN) 5-325 MG  tablet, Take 1 tablet by mouth every 8 (eight) hours as needed for moderate pain., Disp: 90 tablet, Rfl: 0 .  pregabalin (LYRICA) 100 MG capsule, Take 1 capsule (100 mg total) by mouth 2 (two) times daily., Disp: 60 capsule, Rfl: 5 .  tamsulosin (FLOMAX) 0.4 MG CAPS capsule, Take 1 capsule (0.4 mg total) by mouth daily., Disp: 30 capsule, Rfl: 11  PAST MEDICAL HISTORY: Past Medical History:  Diagnosis Date  . Depression   . Headache   . Hearing loss   . Memory loss   . Multiple sclerosis (HCC)   . Vision abnormalities     PAST SURGICAL HISTORY: Past Surgical History:  Procedure Laterality Date  . COLONOSCOPY WITH PROPOFOL N/A 07/01/2015   Procedure: COLONOSCOPY WITH PROPOFOL;  Surgeon: Christena Deem, MD;  Location: [facility] ENDOSCOPY;  Service: Endoscopy;  Laterality: N/A;  . varicose seals      FAMILY HISTORY: Family History  Problem Relation Age of Onset  . Lung cancer Mother   . Heart disease Father   . Prostate cancer Father   . Diabetes type II Father  SOCIAL HISTORY:  Social History   Social History  . Marital status: Married    Spouse name: N/A  . Number of children: N/A  . Years of education: N/A   Occupational History  . Not on file.   Social History Main Topics  . Smoking status: Never Smoker  . Smokeless tobacco: Never Used  . Alcohol use No  . Drug use: No  . Sexual activity: Not on file   Other Topics Concern  . Not on file   Social History Narrative  . No narrative on file     PHYSICAL EXAM  Vitals:   05/12/16 0949  BP: 116/80  Pulse: 68  Resp: (!) 4  Weight: 254 lb (115.2 kg)  Height: 5\' 11"  (1.803 m)    Body mass index is 35.43 kg/m.   General: The patient is well-developed and well-nourished and in no acute distress   Skin: Extremities are without significant edema or rash   Neurologic Exam  Mental status: The patient is alert and oriented x 3 at the time of the examination. The patient has apparent normal recent and  remote memory, with mildly reduced attention span and concentration ability.   Speech is normal.  Cranial nerves: Extraocular movements are full. He reports decreased facial sensation on the left. Facial strength was symmetric. Trapezius and sternocleidomastoid strength is normal. No dysarthria is noted.  The tongue is midline, and the patient has symmetric elevation of the soft palate. No obvious hearing deficits are noted.  Motor:  Muscle bulk is normal.   Tone is increased in legs. Strength is  5 / 5 in arms and proximally in legs. Strength was 4+/5 distally in the legs  Sensory:   On sensory testing, he reports decreased sensation to touch and temperature in the left arm and leg.    Coordination: Cerebellar testing reveals good finger-nose-finger and reduced heel-to-shin bilaterally.  Gait and station: Station is normal. Gait is wide-based and mildly spastic.  Romberg is negative.   Reflexes: Deep tendon reflexes are symmetric and increased in legs bilaterally.      DIAGNOSTIC DATA (LABS, IMAGING, TESTING) - I reviewed patient records, labs, notes, testing and imaging myself where available.      ASSESSMENT AND PLAN  Relapsing remitting multiple sclerosis (HCC)  Bladder sphincter dyssynergia  Migraine without status migrainosus, not intractable, unspecified migraine type  Other fatigue  Attention deficit   1.   Continue [name] for MS.   LFTs were fine recently.   We will check an MRI of the brain to make sure that there has not been subclinical progression. If present, we will need to consider a different medication. 2.   Continue tamsulosin for urinary hesitancy. 3.   Botox for migraine.    He has tried and failed multiple medications (see above). 4.  He will return to see me for Botox or in 4 months,, sooner if there are new or worsening neurologic symptoms.    Richard A. Epimenio Foot, MD, PhD 05/12/2016, 10:31 AM Certified in Neurology, Clinical Neurophysiology, Sleep  Medicine, Pain Medicine and Neuroimaging  [facility] [address], [address] [place], [place] [address] [phone]

## 2016-05-27 ENCOUNTER — Ambulatory Visit (INDEPENDENT_AMBULATORY_CARE_PROVIDER_SITE_OTHER): Payer: BLUE CROSS/BLUE SHIELD

## 2016-05-27 DIAGNOSIS — G35 Multiple sclerosis: Secondary | ICD-10-CM

## 2016-05-27 DIAGNOSIS — G43909 Migraine, unspecified, not intractable, without status migrainosus: Secondary | ICD-10-CM | POA: Diagnosis not present

## 2016-05-28 MED ORDER — GADOPENTETATE DIMEGLUMINE 469.01 MG/ML IV SOLN: 20.0000 mL | Freq: Once | INTRAVENOUS | Status: AC | PRN

## 2016-05-29 ENCOUNTER — Telehealth: Payer: Self-pay | Admitting: *Deleted

## 2016-05-29 NOTE — Telephone Encounter (Signed)
I have spoken with Parker [name] this afternoon, and per RAS, advised that MRI looks good--no new MS changes.  He verbalized understanding of same/fim

## 2016-05-29 NOTE — Telephone Encounter (Signed)
-----   Message from Asa Lente, MD sent at 05/29/2016 11:36 AM EDT ----- Please let him know that the MRI looks good. There are no new MS findings.

## 2016-06-24 ENCOUNTER — Other Ambulatory Visit: Payer: Self-pay | Admitting: Neurology

## 2016-06-25 LAB — BILIRUBIN, FRACTIONATED(TOT/DIR/INDIR)
Bilirubin Total: 0.5 mg/dL (ref 0.0–1.2)
Bilirubin, Direct: 0.14 mg/dL (ref 0.00–0.40)
Bilirubin, Indirect: 0.36 mg/dL (ref 0.10–0.80)

## 2016-06-25 LAB — AST: AST: 12 IU/L (ref 0–40)

## 2016-06-25 LAB — ALT: ALT: 15 IU/L (ref 0–44)

## 2016-07-18 ENCOUNTER — Other Ambulatory Visit: Payer: Self-pay | Admitting: Neurology

## 2016-07-30 ENCOUNTER — Telehealth: Payer: Self-pay | Admitting: Neurology

## 2016-07-30 NOTE — Telephone Encounter (Signed)
Emmanuel/[employer] [phone] [id number] option 2, Reference# [id number] called to advise PA is required if patient is continuing this medication.

## 2016-07-31 NOTE — Telephone Encounter (Signed)
I tried to obtain PA for [name], but was told pt. no longer has an active ins. policy with [employer].  I have spoken with Parker [name]--he sts. ins. is now [employer] and [employer].  He will bring a copy of his cards to our office.  [employer] info--Member ID: [id number].  Group# [id number].  [place] plan# [id number].  Pharm help desk phone# ([facility]) [phone].  Medicare# [id number].  PA completed and approved with [facility]. via phone.   PA# [id number].  Effective thru 10-04-17.  I have spoken with [name] with [employer] and given approval info/fim

## 2016-08-10 ENCOUNTER — Other Ambulatory Visit: Payer: Self-pay | Admitting: Neurology

## 2016-08-11 LAB — ALT: ALT: 14 IU/L (ref 0–44)

## 2016-08-11 LAB — BILIRUBIN, FRACTIONATED(TOT/DIR/INDIR)
BILIRUBIN, DIRECT: 0.11 mg/dL (ref 0.00–0.40)
Bilirubin Total: 0.6 mg/dL (ref 0.0–1.2)
Bilirubin, Indirect: 0.49 mg/dL (ref 0.10–0.80)

## 2016-08-11 LAB — AST: AST: 12 IU/L (ref 0–40)

## 2016-08-13 ENCOUNTER — Telehealth: Payer: Self-pay | Admitting: *Deleted

## 2016-08-13 NOTE — Telephone Encounter (Signed)
-----   Message from Asa Lente, MD sent at 08/12/2016  5:59 PM EST ----- Please let him know that the liver labs are normal. Since he gets monthly blood work, would he prefer [place] to call him only if there is a problem

## 2016-08-13 NOTE — Telephone Encounter (Signed)
I have spoken with Bush this morning and per RAS, advised that [name] labwork was normal.  He verbalized understanding of same/fim

## 2016-09-02 ENCOUNTER — Telehealth: Payer: Self-pay | Admitting: *Deleted

## 2016-09-02 NOTE — Telephone Encounter (Signed)
I  have spoken with Parker [name] this morning.  He requests r/f of Lyrica--sts. he is completely out and req. 90 day supply go to [facility].  He should have med thru the 3rd wk. of December.  [facility] [place] [facility], check r/f dates, and was told by pharmacist there that pt. never picked up Lyrica 100mg  bid that RAS rx's.  Sts. pt. is taking Lyrica 150mg  tid rx'd by Dr. Sula Rumple at the [facility], with last rx. filled being 07/30/16.  I have spoken with [name] and advised he must contact Dr. Elmer Ramp for r/f of this med.  He verbalized understanding of same/fim

## 2016-09-11 ENCOUNTER — Encounter: Payer: Self-pay | Admitting: Neurology

## 2016-09-11 ENCOUNTER — Ambulatory Visit (INDEPENDENT_AMBULATORY_CARE_PROVIDER_SITE_OTHER): Payer: Medicare Other | Admitting: Neurology

## 2016-09-11 VITALS — BP 134/80 | HR 78 | Resp 16 | Ht 71.0 in | Wt 264.0 lb

## 2016-09-11 DIAGNOSIS — R269 Unspecified abnormalities of gait and mobility: Secondary | ICD-10-CM | POA: Diagnosis not present

## 2016-09-11 DIAGNOSIS — H539 Unspecified visual disturbance: Secondary | ICD-10-CM | POA: Diagnosis not present

## 2016-09-11 DIAGNOSIS — G35 Multiple sclerosis: Secondary | ICD-10-CM | POA: Diagnosis not present

## 2016-09-11 DIAGNOSIS — G43709 Chronic migraine without aura, not intractable, without status migrainosus: Secondary | ICD-10-CM | POA: Diagnosis not present

## 2016-09-11 DIAGNOSIS — F32A Depression, unspecified: Secondary | ICD-10-CM

## 2016-09-11 DIAGNOSIS — R5383 Other fatigue: Secondary | ICD-10-CM | POA: Diagnosis not present

## 2016-09-11 DIAGNOSIS — F329 Major depressive disorder, single episode, unspecified: Secondary | ICD-10-CM

## 2016-09-11 MED ORDER — LEVETIRACETAM 500 MG PO TABS: ORAL_TABLET | ORAL | 5 refills | Status: DC

## 2016-09-11 MED ORDER — DULOXETINE HCL 60 MG PO CPEP
60.0000 mg | ORAL_CAPSULE | Freq: Every day | ORAL | 5 refills | Status: DC
Start: 2016-09-11 — End: 2017-02-27

## 2016-09-11 NOTE — Progress Notes (Signed)
[facility]  PATIENT: Parker [name] DOB: Apr 13, 1966  REFERRING DOCTOR OR PCP:  Dr. Cristopher Peru (fax  [phone]) SOURCE: patient, notes, MRI reports and MRI images on PACS  _________________________________   HISTORICAL  CHIEF COMPLAINT:  Chief Complaint  Patient presents with  . Multiple Sclerosis    Sts. he continues to tolerate [name] well.  Sts. he continues to have daily headaches.  Has recently transitioned to Medicare and would like to see if Botox will now be approved/fim    HISTORY OF PRESENT ILLNESS:  Parker [name] is a 50 y.o. man with multiple sclerosis and chronic migraine   MS:   He has been on [place] since late 2016 and he tolerates it well.    LFT's have been normal.   No skin changes.   No new exacerbations.   Mst symptoms are stable  Personally reviewed the MRI of the brain dated 05/27/2016 and compared it with the 05/02/2015 MRI. There are no changes in the MRI over the interval and changes are consistent with multiple sclerosis. No acute findings. 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   Current pain is 8/10.   Pain is throbbing and more often on the right side of his head. When pain is more severe he will get nausea, photophobia and phonophobia. In the past, Botox was very effective in controlling these headaches.   He did not benefit as much from other preventative medicines (see below).   Imitrex ad other triptans have not helped reduce headaches.     Chronic migraine prophylactic medications tried and failed:   He has tried and failed antiepileptic (Topiramate, Depakote IV), NSAIDs (various), opiates (various), Tricyclics (amitriptyline, nortriptyline), muscle relaxants (tizanidine, baclofen).  Triptsans (several different) have not helped relieve the headaches.    Gait/strength/sensation:    Gait is ok but he occasionally  stumbles. He fell a few times but no injuries.   Balance is more of an issue than strength.. He reports numbness in the left > righ side of his body with painful dysesthesias, helped by Lyrica. The dysesthesias are worse in cold weather.  He stopped Ampyra as he is was not getting a benefit in walking.   Legs feel tight at times.    Bladder/bowel: He reports urinary hesitancy is better since starting tamsulosin and he has less nocturia.    He has had constipation and often uses enema.   Vision: He reports some intermittent blurry vision. He wears glasses.  No diplopia.  Fatigue/Sleep:  He physical and cognitive fatigue, worse as the day goes on.  Heat does not alter much.  Amantadine has not helped much. He was once tried on Ritalin but that made him irritable and he stopped.. He has insomnia, sleep maintenance most nights and sleep onset issues some nights..   Mood: He reports depression and anxiety and has had panic attacks. The panic attacks can be worse at night.   He has been on duloxetine but dose is low (20 mg)   Cognition:   He has difficulties with cognition. He notes difficulties with short-term more than long-term memory. There is decreased focus and attention. He has verbal fluency issues.  MS History:   He presented in 2003 with numbness that started in the left foot and then progress to the entire left side over a period of about a month. He was initially worked up but  there was not enough evidence to conclusively call him MS. He was referred to [facility] clinic and saw Dr. Caryn Section. 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 2008, he went on Tysabri and stayed on Tysabri for about 4 or 5 years. He tolerated it very well and felt that he was stable while he was on it. 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 He  switched to Gilenya. He more recently moved to this area. Dr. Sherryll Burger retested him and he is JCV Ab high posititve and placed him on [name].     Data personally reviewed the MRI of the brain dated 05/02/2015. It shows multiple white matter lesions, many of them in the periventricular white matter, some radially oriented to the ventricles. There is a focus within the right cerebellar hemisphere, as well. There are no enhancing lesions. The MRI of the cervical spine from the same day shows a normal spinal cord. At C6-C7 there is L > R foraminal narrowing due to degenerative changes  with some encroachment on the left C7 nerve root.  REVIEW OF SYSTEMS: Constitutional: No fevers, chills, sweats, or change in appetite.   He reports fatigue and poor sleep. Eyes: No visual changes, double vision, eye pain Ear, nose and throat: No hearing loss, ear pain, nasal congestion, sore throat Cardiovascular: No chest pain, palpitations Respiratory: No shortness of breath at rest or with exertion.   No wheezes GastrointestinaI: No nausea, vomiting, diarrhea, abdominal pain, fecal incontinence Genitourinary: as above. Musculoskeletal.    he reports pain in the neck, back and many of his muscles. Integumentary: No rash, pruritus, skin lesions Neurological: as above Psychiatric: Notes depression and anxiety Endocrine: No palpitations, diaphoresis, change in appetite, change in weigh or increased thirst Hematologic/Lymphatic: No anemia, purpura, petechiae. Allergic/Immunologic: No itchy/runny eyes, nasal congestion, recent allergic reactions, rashes  ALLERGIES: Allergies  Allergen Reactions  . Glucosamine Forte [Nutritional Supplements]   . Sulfa Antibiotics   . Sulfur Other (See Comments)    Severe Headaches  . Tapentadol Itching  . Clonazepam Rash  . Imipramine Rash  . Penicillins Rash    HOME MEDICATIONS:  Current Outpatient Prescriptions:  .  amantadine (SYMMETREL) 100 MG capsule, TAKE ONE  CAPSULE BY MOUTH TWICE A DAY, Disp: 60 capsule, Rfl: 5 .  baclofen (LIORESAL) 20 MG tablet, Take 1 tablet (20 mg total) by mouth 3 (three) times daily., Disp: 90 tablet, Rfl: 11 .  Daclizumab ([name]) 150 MG/ML SOSY, Inject 150 mg into the skin every 30 (thirty) days., Disp: 1 Syringe, Rfl: 11 .  DULoxetine (CYMBALTA) 60 MG capsule, Take 1 capsule (60 mg total) by mouth daily., Disp: 30 capsule, Rfl: 5 .  HYDROcodone-acetaminophen (NORCO/VICODIN) 5-325 MG tablet, Take 1 tablet by mouth every 8 (eight) hours as needed for moderate pain., Disp: 90 tablet, Rfl: 0 .  pregabalin (LYRICA) 150 MG capsule, Take 150 mg by mouth 3 (three) times daily., Disp: , Rfl:  .  tamsulosin (FLOMAX) 0.4 MG CAPS capsule, Take 1 capsule (0.4 mg total) by mouth daily., Disp: 30 capsule, Rfl: 11 .  levETIRAcetam (KEPPRA) 500 MG tablet, One po tid, Disp: 90 tablet, Rfl: 5 No current facility-administered medications for this visit.   Facility-Administered Medications Ordered in Other Visits:  .  gadopentetate dimeglumine (MAGNEVIST) injection 20 mL, 20 mL, Intravenous, Once PRN, Asa Lente, MD  PAST MEDICAL HISTORY: Past Medical History:  Diagnosis Date  . Depression   . Headache   . Hearing loss   . Memory loss   . Multiple sclerosis (HCC)   . Vision abnormalities     PAST SURGICAL HISTORY: Past Surgical History:  Procedure Laterality Date  . COLONOSCOPY WITH PROPOFOL N/A 07/01/2015   Procedure: COLONOSCOPY WITH PROPOFOL;  Surgeon: Christena Deem, MD;  Location: [facility] ENDOSCOPY;  Service: Endoscopy;  Laterality: N/A;  . varicose seals      FAMILY HISTORY: Family History  Problem Relation Age of Onset  . Lung cancer Mother   . Heart disease Father   . Prostate cancer Father   . Diabetes type II Father     SOCIAL HISTORY:  Social History   Social History  . Marital status: Married    Spouse name: N/A  . Number of children: N/A  . Years of education: N/A   Occupational History  . Not  on file.   Social History Main Topics  . Smoking status: Never Smoker  . Smokeless tobacco: Never Used  . Alcohol use No  . Drug use: No  . Sexual activity: Not on file   Other Topics Concern  . Not on file   Social History Narrative  . No narrative on file     PHYSICAL EXAM  Vitals:   09/11/16 0903  BP: 134/80  Pulse: 78  Resp: 16  Weight: 264 lb (119.7 kg)  Height: 5\' 11"  (1.803 m)    Body mass index is 36.82 kg/m.   General: The patient is well-developed and well-nourished and in no acute distress   Skin: Extremities are without significant edema or rash.  Other:   Neck is tender at occiput > paraspinal muscles.   Ok ROM   Neurologic Exam  Mental status: The patient is alert and oriented x 3 at the time of the examination. The patient has apparent normal recent and remote memory, with mildly reduced attention span and concentration ability.   Speech is normal.  Cranial nerves: Extraocular movements are full. He reports decreased facial sensation on the left. Facial strength was symmetric. Trapezius and sternocleidomastoid strength is normal. No dysarthria is noted.  The tongue is midline, and the patient has symmetric elevation of the soft palate. No obvious hearing deficits are noted.  Motor:  Muscle bulk is normal.   Tone is increased in legs. Strength is  5 / 5 in arms and proximally in legs. Strength was 4+/5 distally in the legs  Sensory:   On sensory testing, he reports mild decreased sensation to touch and temperature in the left arm and leg.    Coordination: Cerebellar testing reveals good finger-nose-finger and reduced heel-to-shin bilaterally.  Gait and station: Station is normal. Gait is wide-based and mildly spastic.  Tandem is very poor.   Romberg is negative.   Reflexes: Deep tendon reflexes are symmetric and increased in legs bilaterally.      DIAGNOSTIC DATA (LABS, IMAGING, TESTING) - I reviewed patient records, labs, notes, testing and  imaging myself where available.      ASSESSMENT AND PLAN  Relapsing remitting multiple sclerosis (HCC)  Chronic migraine without aura without status migrainosus, not intractable  Depression, unspecified depression type  Gait disturbance  Visual disturbance  Other fatigue   1.   Continue [name] for MS.   LFTs are doing well.    2.   Increase duloxetine to 60 mg daily.    Trial of Keppra of  chronic migraine.  Continue tamsulosin for urinary hesitancy. 3.   Botox for migraine.    He has tried and failed multiple medications (see above in History).  In the past Botox has been effective 4.   He will return to see me for Botox or in 4 months,, sooner if there are new or worsening neurologic symptoms.    Lindon Kiel A. Epimenio FootSater, MD, PhD 09/11/2016, 9:36 AM Certified in Neurology, Clinical Neurophysiology, Sleep Medicine, Pain Medicine and Neuroimaging  [facility] [address], [address] [place], [place] [id number] [phone]

## 2016-09-21 ENCOUNTER — Other Ambulatory Visit: Payer: Self-pay | Admitting: Neurology

## 2016-09-22 LAB — ALT: ALT: 13 IU/L (ref 0–44)

## 2016-09-22 LAB — AST: AST: 14 IU/L (ref 0–40)

## 2016-09-22 LAB — BILIRUBIN, FRACTIONATED(TOT/DIR/INDIR)
BILIRUBIN INDIRECT: 0.3 mg/dL (ref 0.10–0.80)
BILIRUBIN, DIRECT: 0.1 mg/dL (ref 0.00–0.40)
Bilirubin Total: 0.4 mg/dL (ref 0.0–1.2)

## 2016-10-21 ENCOUNTER — Other Ambulatory Visit: Payer: Self-pay | Admitting: Neurology

## 2016-10-30 ENCOUNTER — Other Ambulatory Visit: Payer: Self-pay

## 2016-11-12 LAB — BILIRUBIN, TOTAL: Bilirubin Total: 0.3 mg/dL (ref 0.0–1.2)

## 2016-11-12 LAB — AST: AST: 15 [IU]/L (ref 0–40)

## 2016-11-12 LAB — ALT: ALT: 13 IU/L (ref 0–44)

## 2016-11-16 ENCOUNTER — Encounter: Payer: Self-pay | Admitting: *Deleted

## 2016-12-03 ENCOUNTER — Telehealth: Payer: Self-pay | Admitting: Neurology

## 2016-12-03 MED ORDER — PREGABALIN 150 MG PO CAPS: 150.0000 mg | ORAL_CAPSULE | Freq: Three times a day (TID) | ORAL | 1 refills | Status: DC

## 2016-12-03 NOTE — Telephone Encounter (Signed)
Rx. awaiting RAS sig/fim 

## 2016-12-03 NOTE — Telephone Encounter (Signed)
Pt request refill for pregabalin (LYRICA) 150 MG capsule sent to [facility]

## 2016-12-03 NOTE — Telephone Encounter (Signed)
Rx. faxed to [facility] as requested/fim

## 2016-12-04 ENCOUNTER — Telehealth: Payer: Self-pay | Admitting: *Deleted

## 2016-12-04 NOTE — Telephone Encounter (Signed)
I have spoken with Trevel this afternoon and given appt. with RAS on 12-09-16 at 1400, arrival time 1330-1345, to discuss other tx. options for MS, as [name] will no longer be available/fim

## 2016-12-09 ENCOUNTER — Encounter: Payer: Self-pay | Admitting: Neurology

## 2016-12-09 ENCOUNTER — Ambulatory Visit (INDEPENDENT_AMBULATORY_CARE_PROVIDER_SITE_OTHER): Payer: Medicare Other | Admitting: Neurology

## 2016-12-09 VITALS — BP 119/73 | HR 73 | Resp 16 | Ht 71.0 in | Wt 270.0 lb

## 2016-12-09 DIAGNOSIS — F32A Depression, unspecified: Secondary | ICD-10-CM

## 2016-12-09 DIAGNOSIS — F329 Major depressive disorder, single episode, unspecified: Secondary | ICD-10-CM

## 2016-12-09 DIAGNOSIS — G43709 Chronic migraine without aura, not intractable, without status migrainosus: Secondary | ICD-10-CM | POA: Diagnosis not present

## 2016-12-09 DIAGNOSIS — R269 Unspecified abnormalities of gait and mobility: Secondary | ICD-10-CM

## 2016-12-09 DIAGNOSIS — G35 Multiple sclerosis: Secondary | ICD-10-CM | POA: Diagnosis not present

## 2016-12-09 DIAGNOSIS — K5900 Constipation, unspecified: Secondary | ICD-10-CM | POA: Diagnosis not present

## 2016-12-09 DIAGNOSIS — R5383 Other fatigue: Secondary | ICD-10-CM

## 2016-12-09 DIAGNOSIS — G35D Multiple sclerosis, unspecified: Secondary | ICD-10-CM

## 2016-12-09 MED ORDER — MODAFINIL 200 MG PO TABS: 200.0000 mg | ORAL_TABLET | Freq: Every day | ORAL | 5 refills | Status: DC

## 2016-12-09 NOTE — Progress Notes (Signed)
[facility]  PATIENT: Parker [name] DOB: [birth year]  REFERRING DOCTOR OR PCP:  Dr. Cristopher Peru (fax  [phone]) SOURCE: patient, notes, MRI reports and MRI images on PACS  _________________________________   HISTORICAL  CHIEF COMPLAINT:  Chief Complaint  Patient presents with  . Multiple Sclerosis    Has tolerated [name] well, but it is no longer available. Last inj. was around 11-20-16.  Here to discuss other tx. options/fim    HISTORY OF PRESENT ILLNESS:  Parker [name] is a 51 y.o. man with multiple sclerosis and chronic migraine   MS:   He has been on [place] since late 2016 and he tolerates it well.  It was just taken off the market due to encephalitis cases reported.    LFT's have been normal.  He denies any skin symptoms, abdominal symptoms or significant neurologic changes.  His MS appears to be stable. He did not have any exacerbations while on [name]. However, he did not do as well and he was on Betaseron or Gilenya I have reviewed the MRI of the brain dated 05/27/2016 and compared it with the 05/02/2015 MRI. There are no changes in the MRI over the interval and changes are consistent with multiple sclerosis. No acute findings. 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  His headache pain is 8/10.   Pain is throbbing and more often on the right side of his head. When pain is more severe he will get nausea, photophobia and phonophobia. In the past, Botox was very effective in controlling these headaches.   He has not received much benefit as much from other preventative medicines (see below).   Imitrex and other triptans have not helped reduce headaches.     Chronic migraine prophylactic medications tried and failed:   He has tried and failed antiepileptic (Topiramate, Depakote IV, Keppra), NSAIDs (various), opiates  (various), Tricyclics (amitriptyline, nortriptyline), muscle relaxants (tizanidine, baclofen).  Triptans (several different) have not helped relieve the headaches.    Gait/strength/sensation:    He stumbles and has had rare falls. He feels his gait is off more because of balance then due to strength.. He reports numbness in the left > righ side of his body with painful dysesthesias, helped by Lyrica. The dysesthesias are worse in cold weather.  He stopped Ampyra as he is was not getting a benefit in walking.   Legs feel tight at times.    Bladder/bowel: He reports urinary hesitancy is better since starting tamsulosin and he has less nocturia.    He has had constipation and often uses enema.    We discussed using MiraLAX.  Vision: He reports some intermittent blurry vision. He wears glasses.  No diplopia.  Fatigue/Sleep:  He reports physical and cognitive fatigue, worse as the day goes on.   Heat has not greatly affected the fatigue.  Amantadine has not helped much. He was once tried on Ritalin but it did not help his focus and he became irritable so he stopped.   . He has insomnia, sleep maintenance most nights and sleep onset issues some nights..   Mood: He reports depression and anxiety and has had panic attacks. The panic attacks can be worse at night.   He does not think increasing the duloxetine has made much difference.  Cognition:   He reports difficulties with cognition. Specifically he has noted difficulty with short-term  memory, reduce focus and attention, verbal fluency and executive function. .  MS History:   He presented in 2003 with numbness that started in the left foot and then progress to the entire left side over a period of about a month. He was initially worked up but there was not enough evidence to conclusively call him MS. He was referred to [facility] and saw Dr. Caryn Section. 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 2008, he went on Tysabri and stayed on Tysabri for about 4 or 5 years. He tolerated it very well and felt that he was stable while he was on it. 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 He switched to Gilenya. He more recently moved to this area. Dr. Sherryll Burger retested him and he is JCV Ab high posititve and placed him on [name].     Data personally reviewed the MRI of the brain dated 05/02/2015. It shows multiple white matter lesions, many of them in the periventricular white matter, some radially oriented to the ventricles. There is a focus within the right cerebellar hemisphere, as well. There are no enhancing lesions. The MRI of the cervical spine from the same day shows a normal spinal cord. At C6-C7 there is L > R foraminal narrowing due to degenerative changes  with some encroachment on the left C7 nerve root.  REVIEW OF SYSTEMS: Constitutional: No fevers, chills, sweats, or change in appetite.   He reports fatigue and poor sleep. Eyes: No visual changes, double vision, eye pain Ear, nose and throat: No hearing loss, ear pain, nasal congestion, sore throat Cardiovascular: No chest pain, palpitations Respiratory: No shortness of breath at rest or with exertion.   No wheezes GastrointestinaI: No nausea, vomiting, diarrhea, abdominal pain, fecal incontinence Genitourinary: as above. Musculoskeletal.    he reports pain in the neck, back and many of his muscles. Integumentary: No rash, pruritus, skin lesions Neurological: as above Psychiatric: Notes depression and anxiety Endocrine: No palpitations, diaphoresis, change in appetite, change in weigh or increased thirst Hematologic/Lymphatic: No anemia, purpura, petechiae. Allergic/Immunologic: No itchy/runny eyes, nasal congestion, recent allergic reactions, rashes  ALLERGIES: Allergies  Allergen Reactions  . Glucosamine  Forte [Nutritional Supplements]   . Sulfa Antibiotics   . Sulfur Other (See Comments)    Severe Headaches  . Tapentadol Itching  . Clonazepam Rash  . Imipramine Rash  . Penicillins Rash    HOME MEDICATIONS:  Current Outpatient Prescriptions:  .  amantadine (SYMMETREL) 100 MG capsule, TAKE ONE CAPSULE BY MOUTH TWICE A DAY, Disp: 60 capsule, Rfl: 5 .  baclofen (LIORESAL) 20 MG tablet, TAKE 1 TABLET BY MOUTH THREE TIMES DAILY, Disp: 90 tablet, Rfl: 2 .  DULoxetine (CYMBALTA) 60 MG capsule, Take 1 capsule (60 mg total) by mouth daily., Disp: 30 capsule, Rfl: 5 .  HYDROcodone-acetaminophen (NORCO/VICODIN) 5-325 MG tablet, Take 1 tablet by mouth every 8 (eight) hours as needed for moderate pain., Disp: 90 tablet, Rfl: 0 .  levETIRAcetam (KEPPRA) 500 MG tablet, One po tid, Disp: 90 tablet, Rfl: 5 .  pregabalin (LYRICA) 150 MG capsule, Take 1 capsule (150 mg total) by mouth 3 (three) times daily., Disp: 270 capsule, Rfl: 1 .  tamsulosin (FLOMAX) 0.4 MG CAPS capsule, TAKE 1 CAPSULE(0.4 MG) BY MOUTH DAILY, Disp: 30 capsule, Rfl: 2 .  Daclizumab ([name]) 150 MG/ML SOSY, Inject 150  mg into the skin every 30 (thirty) days. (Patient not taking: Reported on 12/09/2016), Disp: 1 Syringe, Rfl: 11 .  modafinil (PROVIGIL) 200 MG tablet, Take 1 tablet (200 mg total) by mouth daily., Disp: 30 tablet, Rfl: 5 No current facility-administered medications for this visit.   Facility-Administered Medications Ordered in Other Visits:  .  gadopentetate dimeglumine (MAGNEVIST) injection 20 mL, 20 mL, Intravenous, Once PRN, Asa Lente, MD  PAST MEDICAL HISTORY: Past Medical History:  Diagnosis Date  . Depression   . Headache   . Hearing loss   . Memory loss   . Multiple sclerosis (HCC)   . Vision abnormalities     PAST SURGICAL HISTORY: Past Surgical History:  Procedure Laterality Date  . COLONOSCOPY WITH PROPOFOL N/A 07/01/2015   Procedure: COLONOSCOPY WITH PROPOFOL;  Surgeon: Christena Deem, MD;   Location: [facility] ENDOSCOPY;  Service: Endoscopy;  Laterality: N/A;  . varicose seals      FAMILY HISTORY: Family History  Problem Relation Age of Onset  . Lung cancer Mother   . Heart disease Father   . Prostate cancer Father   . Diabetes type II Father     SOCIAL HISTORY:  Social History   Social History  . Marital status: Married    Spouse name: N/A  . Number of children: N/A  . Years of education: N/A   Occupational History  . Not on file.   Social History Main Topics  . Smoking status: Never Smoker  . Smokeless tobacco: Never Used  . Alcohol use No  . Drug use: No  . Sexual activity: Not on file   Other Topics Concern  . Not on file   Social History Narrative  . No narrative on file     PHYSICAL EXAM  Vitals:   12/09/16 1402  BP: 119/73  Pulse: 73  Resp: 16  Weight: 270 lb (122.5 kg)  Height: 5\' 11"  (1.803 m)    Body mass index is 37.66 kg/m.   General: The patient is well-developed and well-nourished and in no acute distress   Skin: Extremities are without significant edema or rash.  Other:   Neck is tender at occiput > paraspinal muscles.   Ok ROM   Neurologic Exam  Mental status: The patient is alert and oriented x 3 at the time of the examination. The patient has apparent normal recent and remote memory, with mildly reduced attention span and concentration ability.   Speech is normal.  Cranial nerves: Extraocular movements are full. He reports decreased facial sensation on the left. Facial strength was symmetric. Trapezius and sternocleidomastoid strength is normal. No dysarthria is noted.  The tongue is midline, and the patient has symmetric elevation of the soft palate. No obvious hearing deficits are noted.  Motor:  Muscle bulk is normal.   Tone is increased in legs. Strength is  5 / 5 in arms and proximally in legs. Strength was 4+/5 distally in the legs  Sensory:   On sensory testing, He reports decreased touch temperature and vibration  sensation in the left arm and leg.    Coordination: Cerebellar testing reveals good finger-nose-finger and reduced heel-to-shin bilaterally.  Gait and station: Station is normal. Gait is wide-based and mildly spastic.  Tandem is very poor and needs support.   Romberg is negative.   Reflexes: Deep tendon reflexes are symmetric and increased in legs bilaterally.       DIAGNOSTIC DATA (LABS, IMAGING, TESTING) - I reviewed patient records, labs, notes, testing  and imaging myself where available.      ASSESSMENT AND PLAN  Multiple sclerosis (HCC) - Plan: Hepatitis B core antibody, total, Hepatitis B surface antigen, Quantiferon tb gold assay (blood), CBC with Differential/Platelet, Hepatitis B surface antibody, Hepatic function panel  Chronic migraine without aura without status migrainosus, not intractable  Depression, unspecified depression type  Constipation, unspecified constipation type  Other fatigue  Gait disturbance   1.   Change [name] to Ocrelizumab for MS.  we went over the risks and benefits of ocrelizumab in detail. He understands that he needs to be tested for hepatitis B and tuberculosis as ocrelizumab can reactivate these if he has a latent infection.  Although there have been no cases of PML with ocrelizumab, there is a theoretical risk.  Check labs.      2.   Continue duloxetine to 60 mg daily.  Start Provigil for fatigue.  3.   Botox for migraine.    He has tried and failed multiple medications (see above in History).  In the past Botox has been effective but when insurance changed 2 years ago, he went off.   4.    Miralax for constipation. 4.   He will return to see me for Botox or in 4 months,, sooner if there are new or worsening neurologic symptoms.  45 minute face-to-face evaluation with greater than one half the time counseling or coordinating care about disease modifying therapeutic options, options for his headaches including Botox and fatigue.  Danton Palmateer  A. Epimenio Foot, MD, PhD 12/09/2016, 2:44 PM Certified in Neurology, Clinical Neurophysiology, Sleep Medicine, Pain Medicine and Neuroimaging  [facility] [address], [address] [place], [place] [address] [phone]

## 2016-12-10 LAB — HEPATIC FUNCTION PANEL
ALBUMIN: 4.5 g/dL (ref 3.5–5.5)
ALT: 18 IU/L (ref 0–44)
AST: 17 IU/L (ref 0–40)
Alkaline Phosphatase: 98 IU/L (ref 39–117)
BILIRUBIN TOTAL: 0.4 mg/dL (ref 0.0–1.2)
Bilirubin, Direct: 0.14 mg/dL (ref 0.00–0.40)
Total Protein: 6.7 g/dL (ref 6.0–8.5)

## 2016-12-10 LAB — CBC WITH DIFFERENTIAL/PLATELET
BASOS ABS: 0.1 10*3/uL (ref 0.0–0.2)
Basos: 1 %
EOS (ABSOLUTE): 0.2 10*3/uL (ref 0.0–0.4)
Eos: 2 %
Hematocrit: 42 % (ref 37.5–51.0)
Hemoglobin: 14.4 g/dL (ref 13.0–17.7)
Immature Grans (Abs): 0 10*3/uL (ref 0.0–0.1)
Immature Granulocytes: 0 %
LYMPHS: 25 %
Lymphocytes Absolute: 1.7 10*3/uL (ref 0.7–3.1)
MCH: 30.8 pg (ref 26.6–33.0)
MCHC: 34.3 g/dL (ref 31.5–35.7)
MCV: 90 fL (ref 79–97)
Monocytes Absolute: 0.6 10*3/uL (ref 0.1–0.9)
Monocytes: 8 %
NEUTROS ABS: 4.4 10*3/uL (ref 1.4–7.0)
Neutrophils: 64 %
PLATELETS: 235 10*3/uL (ref 150–379)
RBC: 4.67 x10E6/uL (ref 4.14–5.80)
RDW: 13.6 % (ref 12.3–15.4)
WBC: 6.9 10*3/uL (ref 3.4–10.8)

## 2016-12-10 LAB — HEPATITIS B SURFACE ANTIGEN: HEP B S AG: NEGATIVE

## 2016-12-10 LAB — HEPATITIS B SURFACE ANTIBODY,QUALITATIVE: HEP B SURFACE AB, QUAL: NONREACTIVE

## 2016-12-10 LAB — HEPATITIS B CORE ANTIBODY, TOTAL: HEP B C TOTAL AB: NEGATIVE

## 2016-12-12 LAB — QUANTIFERON IN TUBE
QFT TB AG MINUS NIL VALUE: <0 IU/mL
QUANTIFERON MITOGEN VALUE: 5.16 IU/mL
QUANTIFERON NIL VALUE: 0.06 [IU]/mL
QUANTIFERON TB AG VALUE: 0.03 [IU]/mL
QUANTIFERON TB GOLD: NEGATIVE

## 2016-12-12 LAB — QUANTIFERON TB GOLD ASSAY (BLOOD)

## 2016-12-15 ENCOUNTER — Encounter: Payer: Self-pay | Admitting: *Deleted

## 2016-12-15 ENCOUNTER — Telehealth: Payer: Self-pay | Admitting: *Deleted

## 2016-12-15 NOTE — Telephone Encounter (Signed)
Ocrelizumab srf completed and given to [facility] in the infusion suite/fim

## 2016-12-15 NOTE — Telephone Encounter (Signed)
-----   Message from Asa Lente, MD sent at 12/14/2016 11:18 AM EDT ----- Hepatitis and TB test are fine. We can send in the ocrelizumab form.

## 2016-12-15 NOTE — Telephone Encounter (Signed)
Modafinil PA completed via Cover My Meds.  Fax received from [facility], telephone# [phone].  Modafinil 200mg  once daily approved for dx. of fatigue related to MS.  Effective for 3 mos.--thru 03-17-17.  PA# [id number]

## 2016-12-23 MED ORDER — MODAFINIL 200 MG PO TABS: 200.0000 mg | ORAL_TABLET | Freq: Every day | ORAL | 1 refills | Status: DC

## 2016-12-23 NOTE — Addendum Note (Signed)
Addended by: Candis Schatz I on: 12/23/2016 04:41 PM   Modules accepted: Orders

## 2016-12-23 NOTE — Telephone Encounter (Signed)
Pt called said medication is going to be very expensive but [facility] advised provider could call to get it approved from tier 4 to tier 3 or could prescribe belsomra. Pt is not familiar with Cover my meds program. please call at [phone]

## 2016-12-23 NOTE — Telephone Encounter (Signed)
I have spoken with Parker [name] this afternoon.  Even with PA approval for Modafinil, copay is too high.  I have explained that cash price for 90 day rx. at [facility] is $78.66.  He is agreeable with this. Rx. faxed to [facility]

## 2017-01-13 ENCOUNTER — Ambulatory Visit: Payer: Medicare Other | Admitting: Neurology

## 2017-01-25 ENCOUNTER — Other Ambulatory Visit: Payer: Self-pay | Admitting: Neurology

## 2017-02-13 ENCOUNTER — Other Ambulatory Visit: Payer: Self-pay | Admitting: Neurology

## 2017-02-26 ENCOUNTER — Ambulatory Visit: Payer: Medicare Other | Admitting: Neurology

## 2017-02-27 ENCOUNTER — Other Ambulatory Visit: Payer: Self-pay | Admitting: Neurology

## 2017-03-22 ENCOUNTER — Telehealth: Payer: Self-pay | Admitting: Neurology

## 2017-03-22 MED ORDER — GABAPENTIN 600 MG PO TABS
600.0000 mg | ORAL_TABLET | Freq: Three times a day (TID) | ORAL | 0 refills | Status: DC
Start: 2017-03-22 — End: 2017-05-27

## 2017-03-22 NOTE — Telephone Encounter (Signed)
Pt said pregabalin (LYRICA) 150 MG capsule is now costing $450/mth. He said he will need to go back to gabapentin. Please call

## 2017-03-22 NOTE — Telephone Encounter (Signed)
I have spoken with Deric this afternoon and per RAS, advised ok for Gabapentin 600mg  po tid.  Rx. escribed to [facility] per his request/fim

## 2017-03-22 NOTE — Addendum Note (Signed)
Addended by: Candis Schatz I on: 03/22/2017 04:40 PM   Modules accepted: Orders

## 2017-03-30 ENCOUNTER — Telehealth: Payer: Self-pay | Admitting: *Deleted

## 2017-03-30 NOTE — Telephone Encounter (Signed)
PA for Modafinil 200mg  once daily completed and submitted via Cover My Meds.  Dx.: MS related fatigue./fim

## 2017-05-27 ENCOUNTER — Telehealth: Payer: Self-pay | Admitting: *Deleted

## 2017-05-27 MED ORDER — GABAPENTIN 600 MG PO TABS: 600.0000 mg | ORAL_TABLET | Freq: Three times a day (TID) | ORAL | 1 refills | Status: DC

## 2017-05-27 NOTE — Telephone Encounter (Signed)
Gabapentin escribed to [facility] per faxed request/fim 

## 2017-06-29 ENCOUNTER — Other Ambulatory Visit: Payer: Self-pay | Admitting: Neurology

## 2017-06-30 ENCOUNTER — Telehealth: Payer: Self-pay | Admitting: Neurology

## 2017-06-30 NOTE — Telephone Encounter (Signed)
I have spoken with Parker [name] this morning. He c/o intermittent bilat arm numbness from the elbows down.  Left arm worse than right. Sometimes worse when holding something for a long time.  Able to resolve numbness with movement at times. Onset one mo. ago. Appt. with RAS given 07/07/17./fim

## 2017-06-30 NOTE — Telephone Encounter (Signed)
Patient is calling. For the past month patient has been experiencing numbness in both arms with pain. Please call and discuss.

## 2017-07-07 ENCOUNTER — Encounter: Payer: Self-pay | Admitting: Neurology

## 2017-07-07 ENCOUNTER — Ambulatory Visit (INDEPENDENT_AMBULATORY_CARE_PROVIDER_SITE_OTHER): Payer: Medicare Other | Admitting: Neurology

## 2017-07-07 VITALS — BP 130/76 | HR 93 | Resp 16 | Ht 71.0 in | Wt 260.0 lb

## 2017-07-07 DIAGNOSIS — R200 Anesthesia of skin: Secondary | ICD-10-CM | POA: Insufficient documentation

## 2017-07-07 DIAGNOSIS — R269 Unspecified abnormalities of gait and mobility: Secondary | ICD-10-CM

## 2017-07-07 DIAGNOSIS — R5383 Other fatigue: Secondary | ICD-10-CM | POA: Diagnosis not present

## 2017-07-07 DIAGNOSIS — R3911 Hesitancy of micturition: Secondary | ICD-10-CM

## 2017-07-07 DIAGNOSIS — G35 Multiple sclerosis: Secondary | ICD-10-CM

## 2017-07-07 DIAGNOSIS — G43709 Chronic migraine without aura, not intractable, without status migrainosus: Secondary | ICD-10-CM

## 2017-07-07 NOTE — Progress Notes (Signed)
[facility]  PATIENT: Parker [name] DOB: [birth year]  REFERRING DOCTOR OR PCP:  Dr. Cristopher Peru (fax  [phone]) SOURCE: patient, notes, MRI reports and MRI images on PACS  _________________________________   HISTORICAL  CHIEF COMPLAINT:  Chief Complaint  Patient presents with  . Multiple Sclerosis    Tolerating Ocrevus well. Next infusion 10/29/16. Pneumonia after 1st infusion and still has some prod. cough with green sputum, nasal congestion. No fever. Today c/o bilat intermittent arm pain/numbness from elbows down. Some in left shoulder. Onset 3 wks ago, some better since onset. Unable to afford Lyrica, so using Gabapentin and doesn't think it helps as much/fim    HISTORY OF PRESENT ILLNESS:  Parker [name] is a 51 y.o. man with multiple sclerosis and chronic migraine   Update 07/07/2017:   Earlier this year, he switched from [place] to ocrelizumab due to [place] being removed from the market because of multiple cases of encephalitis. He follow through with the liver testing for the next 6 months and liver tests were fine. He tolerated the ocrelizumab infusion well but had a pneumonia and cough afterwards.   He sees ENT about this.   He has not had any definite exacerbation though he feels his balance is doing worse.   He also notes more numbness in his arms that is painful.     The numbness sometimes awakens him at night.    This occurs left > right.    He feels weak when the tingling is more severe.      Flomax helps the bladder hesitancy some but not always.    Fatigue fluctuates and is worse with temperature extremes.    He sleeps poorly between the headaches and numbness.     He notes mild depression.     Migraines are still occurring on a daily basis (30/30 days a month for greater than 4 hours a day).  Unfortunately, his co-pay for Botox is very high through his insurance.    We discussed Ajovy, one of the anti-CGRP compounds.  The migraines are associated with  throbbing pain that occurs on the right side more than the left. He has not received benefit from a generative medications though he did receive a benefit from Botox in the past. Triptan's did not help to reduce headache pain when present.   ___________________________ From 12/09/2016:  MS:   He has been on [place] since late 2016 and he tolerates it well.  It was just taken off the market due to encephalitis cases reported.    LFT's have been normal.  He denies any skin symptoms, abdominal symptoms or significant neurologic changes.  His MS appears to be stable. He did not have any exacerbations while on [name]. However, he did not do as well and he was on Betaseron or Gilenya I have reviewed the MRI of the brain dated 05/27/2016 and compared it with the 05/02/2015 MRI. There are no changes in the MRI over the interval and changes are consistent with multiple sclerosis. No acute findings. 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  His headache pain is 8/10.   Pain is throbbing and more often on the right side of his head. When pain is more severe he will get nausea, photophobia and phonophobia. In the past, Botox was very effective in controlling these headaches.   He has not received  much benefit as much from other preventative medicines (see below).   Imitrex and other triptans have not helped reduce headaches.     Chronic migraine prophylactic medications tried and failed:   He has tried and failed antiepileptic (Topiramate, Depakote IV, Keppra), NSAIDs (various), opiates (various), Tricyclics (amitriptyline, nortriptyline), muscle relaxants (tizanidine, baclofen).  Triptans (several different) have not helped relieve the headaches.    Gait/strength/sensation:    He stumbles and has had rare falls. He feels his gait is off more because of balance then due to  strength.. He reports numbness in the left > righ side of his body with painful dysesthesias, helped by Lyrica. The dysesthesias are worse in cold weather.  He stopped Ampyra as he is was not getting a benefit in walking.   Legs feel tight at times.    Bladder/bowel: He reports urinary hesitancy is better since starting tamsulosin and he has less nocturia.    He has had constipation and often uses enema.    We discussed using MiraLAX.  Vision: He reports some intermittent blurry vision. He wears glasses.  No diplopia.  Fatigue/Sleep:  He reports physical and cognitive fatigue, worse as the day goes on.   Heat has not greatly affected the fatigue.  Amantadine has not helped much. He was once tried on Ritalin but it did not help his focus and he became irritable so he stopped.   . He has insomnia, sleep maintenance most nights and sleep onset issues some nights..   Mood: He reports depression and anxiety and has had panic attacks. The panic attacks can be worse at night.   He does not think increasing the duloxetine has made much difference.  Cognition:   He reports difficulties with cognition. Specifically he has noted difficulty with short-term memory, reduce focus and attention, verbal fluency and executive function. .  MS History:   He presented in 2003 with numbness that started in the left foot and then progress to the entire left side over a period of about a month. He was initially worked up but there was not enough evidence to conclusively call him MS. He was referred to [facility] clinic and saw Dr. Caryn Section. 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 2008, he went on Tysabri and stayed on Tysabri for about 4 or 5 years. He tolerated it very well and felt that he was stable while he was on it. 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 He switched to Gilenya. He more recently moved to this area. Dr. Sherryll Burger retested him and he is JCV Ab high posititve and placed him on [name].     Data personally reviewed the MRI of the brain dated 05/02/2015. It shows multiple white matter lesions, many of them in the periventricular white matter, some radially oriented to the ventricles. There is a focus within the right cerebellar hemisphere, as well. There are no enhancing lesions. The MRI of the cervical spine from the same day shows a normal spinal cord. At C6-C7 there is L > R foraminal narrowing due to degenerative changes  with some encroachment on the left C7 nerve root.  REVIEW OF SYSTEMS: Constitutional: No fevers, chills, sweats, or change in appetite.   He reports fatigue and poor sleep. Eyes: No visual changes, double vision, eye pain Ear, nose and throat: No hearing loss,  ear pain, nasal congestion, sore throat Cardiovascular: No chest pain, palpitations Respiratory: No shortness of breath at rest or with exertion.   No wheezes GastrointestinaI: No nausea, vomiting, diarrhea, abdominal pain, fecal incontinence Genitourinary: as above. Musculoskeletal.    he reports pain in the neck, back and many of his muscles. Integumentary: No rash, pruritus, skin lesions Neurological: as above Psychiatric: Notes depression and anxiety Endocrine: No palpitations, diaphoresis, change in appetite, change in weigh or increased thirst Hematologic/Lymphatic: No anemia, purpura, petechiae. Allergic/Immunologic: No itchy/runny eyes, nasal congestion, recent allergic reactions, rashes  ALLERGIES: Allergies  Allergen Reactions  . Glucosamine Forte [Nutritional Supplements]   . Sulfa Antibiotics   . Sulfur Other (See Comments)    Severe Headaches  . Tapentadol Itching  . Clonazepam Rash  . Imipramine Rash  . Penicillins Rash    HOME MEDICATIONS:  Current Outpatient Prescriptions:  .  amantadine (SYMMETREL) 100 MG  capsule, TAKE ONE CAPSULE BY MOUTH TWICE A DAY, Disp: 60 capsule, Rfl: 5 .  baclofen (LIORESAL) 20 MG tablet, TAKE 1 TABLET BY MOUTH THREE TIMES DAILY, Disp: 90 tablet, Rfl: 5 .  DULoxetine (CYMBALTA) 60 MG capsule, TAKE 1 CAPSULE(60 MG) BY MOUTH DAILY, Disp: 90 capsule, Rfl: 3 .  gabapentin (NEURONTIN) 600 MG tablet, Take 1 tablet (600 mg total) by mouth 3 (three) times daily., Disp: 270 tablet, Rfl: 1 .  HYDROcodone-acetaminophen (NORCO/VICODIN) 5-325 MG tablet, Take 1 tablet by mouth every 8 (eight) hours as needed for moderate pain., Disp: 90 tablet, Rfl: 0 .  levETIRAcetam (KEPPRA) 500 MG tablet, One po tid, Disp: 90 tablet, Rfl: 5 .  modafinil (PROVIGIL) 200 MG tablet, TAKE 1 TABLET BY MOUTH ONCE A DAY , Disp: 90 tablet, Rfl: 0 .  ocrelizumab 300 mg in sodium chloride 0.9 % 250 mL, Inject 300 mg into the vein once., Disp: , Rfl:  .  ocrelizumab 600 mg in sodium chloride 0.9 % 500 mL, Inject 600 mg into the vein every 6 (six) months., Disp: , Rfl:  .  tamsulosin (FLOMAX) 0.4 MG CAPS capsule, TAKE 1 CAPSULE(0.4 MG) BY MOUTH DAILY, Disp: 30 capsule, Rfl: 5 No current facility-administered medications for this visit.   Facility-Administered Medications Ordered in Other Visits:  .  gadopentetate dimeglumine (MAGNEVIST) injection 20 mL, 20 mL, Intravenous, Once PRN, Corina Stacy, Pearletha Furl, MD  PAST MEDICAL HISTORY: Past Medical History:  Diagnosis Date  . Depression   . Headache   . Hearing loss   . Memory loss   . Multiple sclerosis (HCC)   . Vision abnormalities     PAST SURGICAL HISTORY: Past Surgical History:  Procedure Laterality Date  . COLONOSCOPY WITH PROPOFOL N/A 07/01/2015   Procedure: COLONOSCOPY WITH PROPOFOL;  Surgeon: Christena Deem, MD;  Location: [facility] ENDOSCOPY;  Service: Endoscopy;  Laterality: N/A;  . varicose seals      FAMILY HISTORY: Family History  Problem Relation Age of Onset  . Lung cancer Mother   . Heart disease Father   . Prostate cancer Father   .  Diabetes type II Father     SOCIAL HISTORY:  Social History   Social History  . Marital status: Married    Spouse name: N/A  . Number of children: N/A  . Years of education: N/A   Occupational History  . Not on file.   Social History Main Topics  . Smoking status: Never Smoker  . Smokeless tobacco: Never Used  . Alcohol use No  . Drug use: No  . Sexual activity:  Not on file   Other Topics Concern  . Not on file   Social History Narrative  . No narrative on file     PHYSICAL EXAM  Vitals:   07/07/17 1521  BP: 130/76  Pulse: 93  Resp: 16  Weight: 260 lb (117.9 kg)  Height:  (1.803 m)    Body mass index is 36.26 kg/m.   General: The patient is well-developed and well-nourished and in no acute distress   Skin: Extremities are without significant edema or rash.  Other:   He has mild occipital tenderness.  Ok ROM   Neurologic Exam  Mental status: The patient is alert and oriented x 3 at the time of the examination. The patient has apparent normal recent and remote memory, with mildly reduced attention span and concentration ability.   Speech is normal.  Cranial nerves: Extraocular movements are full. He reports decreased facial sensation on the left. Facial strength was symmetric. Trapezius and sternocleidomastoid strength is normal. No dysarthria is noted.  The tongue is midline, and the patient has symmetric elevation of the soft palate. No obvious hearing deficits are noted.  Motor:  Muscle bulk is normal.   Tone is increased in legs. Strength is  5 / 5 in arms and proximally in legs. Strength was 4+/5 distally in the legs and 4+/5 in the APB muscles   Sensory:   On sensory testing, He reports decreased touch  in the left arm and leg but decreased temperature and vibration sensation on the right.      Coordination: Cerebellar testing reveals good finger-nose-finger and reduced heel-to-shin bilaterally.  Gait and station: Station is normal. The gait  is wide-based and he is mildly spastic. He has difficulty to doing a tandem walk.   Reflexes: Deep tendon reflexes are symmetric and increased in legs bilaterally.   No ankle clonus.    DIAGNOSTIC DATA (LABS, IMAGING, TESTING) - I reviewed patient records, labs, notes, testing and imaging myself where available.      ASSESSMENT AND PLAN  Multiple sclerosis (HCC)  Numbness  Gait disturbance  Delayed onset of urination  Other fatigue  Chronic migraine without aura without status migrainosus, not intractable    1.   Continue Ocrelizumab for MS.   Fusion will be in 3 weeks..      2.   We discussed the anti-CGRP compounds for his chronic daily migraine.   We will prescribe Ajovy 225 g monthly (could also take 675 mg quarterly).   If this does not help, we will try again to get him arthritis for Botox. 3.   Continue other medications. 4.   He might have CTS in his hands.    We discussed getting splints to wear at night and we will check a nerve conduction study and EMG.   5.   Return for the NCV/EMG and in 6 months for an MS follow-up.  Call sooner if there are new or worsening neurologic symptoms per   40 minutes face-to-face evaluation with greater than one half the time counseling and coordinating care about his chronic migraines and his MS and related symptoms.  Khiree Bukhari A. Epimenio Foot, MD, PhD 07/07/2017, 4:28 PM Certified in Neurology, Clinical Neurophysiology, Sleep Medicine, Pain Medicine and Neuroimaging  [facility] [address], [address] [place], [place] [address] [phone]

## 2017-07-26 ENCOUNTER — Other Ambulatory Visit: Payer: Self-pay | Admitting: Neurology

## 2017-07-27 ENCOUNTER — Other Ambulatory Visit: Payer: Self-pay | Admitting: Neurology

## 2017-08-17 ENCOUNTER — Ambulatory Visit: Payer: Medicare Other | Admitting: Neurology

## 2017-08-17 ENCOUNTER — Ambulatory Visit (INDEPENDENT_AMBULATORY_CARE_PROVIDER_SITE_OTHER): Payer: Medicare Other | Admitting: Neurology

## 2017-08-17 DIAGNOSIS — R200 Anesthesia of skin: Secondary | ICD-10-CM

## 2017-08-17 DIAGNOSIS — G5602 Carpal tunnel syndrome, left upper limb: Secondary | ICD-10-CM

## 2017-08-17 DIAGNOSIS — Z0289 Encounter for other administrative examinations: Secondary | ICD-10-CM

## 2017-08-17 NOTE — Progress Notes (Signed)
Full Name: Parker [name] Gender: Male MRN #: [id number] Date of Birth: [birth year]    Visit Date: 08/17/2017 13:43 Age: [age] Years 3 Months Old Examining Physician: Despina Ariasichard Sater, MD     History:  Mr. Parker [name] is a 51 year old man with left much greater than right hand numbness. He has a little bit of numbness and tingling at times higher up in the arm.  Nerve conduction studies:  The left median motor response had a relatively delayed distal latency when compared to the right. Its amplitude and forearm conduction was normal. Right median motor response of both ulnar motor responses were normal. The  F wave latencies were normal.    The left median sensory response had a delayed peak latency and low normal amplitude. The right median sensory response and both ulnar sensory responses were normal.  Electromyography:  Needle EMG of select muscles of the left arm was performed and there was mild chronic denervation in 2 of the 3 C7 innervated muscles tested. There was mild chronic elevation in the left abductor pollicis brevis muscle.  Impression: This EMG/NCV study shows the following: 1.   Moderate left median neuropathy across the wrist (carpal tunnel syndrome) 2.   Minimal chronic left C7 radiculopathy without active features.  Parker A. Epimenio FootSater, MD, PhD, FAAN Certified in Neurology, Clinical Neurophysiology, Sleep Medicine, Pain Medicine and Neuroimaging Director, [facility]  [facility] [address], [address] [place], [place] [address] [phone]  Clinical addendum: He is advised to obtain a carpal tunnel splint to wear on the left at night. If symptoms do not improve I will consider referring him to to surgery. --RAS       MNC    Nerve / Sites Muscle Latency Ref. Amplitude Ref. Rel Amp Segments Distance Velocity Ref. Area    ms ms mV mV %  cm m/s m/s mVms  L Median - APB     Wrist APB 4.2 ?4.4 11.9 ?4.0  100 Wrist - APB 7   44.2     Upper arm APB 8.5  11.5  96.9 Upper arm - Wrist 25 58 ?49 41.9  R Median - APB     Wrist APB 3.6 ?4.4 19.9 ?4.0 100 Wrist - APB 7   66.1     Upper arm APB 7.9  18.5  92.9 Upper arm - Wrist 23 54 ?49 61.4         Axilla - Wrist      L Ulnar - ADM     Wrist ADM 2.6 ?3.3 9.7 ?6.0 100 Wrist - ADM 7   38.7     B.Elbow ADM 6.3  9.7  99.3 B.Elbow - Wrist 22 60 ?49 37.1     A.Elbow ADM 7.8  9.3  96.2 A.Elbow - B.Elbow 10 64 ?49 36.8         A.Elbow - Wrist      R Ulnar - ADM     Wrist ADM 2.8 ?3.3 12.0 ?6.0 100 Wrist - ADM 7   43.1     B.Elbow ADM 6.4  11.7  98.2 B.Elbow - Wrist 21 58 ?49 41.5     A.Elbow ADM 8.1  11.7  99.5 A.Elbow - B.Elbow 10 60 ?49 41.6         A.Elbow - Wrist                 SNC    Nerve /  Sites Rec. Site Peak Lat Amp Segments Distance    ms V  cm  L Median - Orthodromic (Dig II, Mid palm)     Dig II Wrist 4.9 20 Dig II - Wrist 13  R Median - Orthodromic (Dig II, Mid palm)     Dig II Wrist 3.0 34 Dig II - Wrist 13  L Ulnar - Orthodromic, (Dig V, Mid palm)     Dig V Wrist 3.0 31 Dig V - Wrist 11  R Ulnar - Orthodromic, (Dig V, Mid palm)     Dig V Wrist 3.0 25 Dig V - Wrist 35             F  Wave    Nerve F Lat Ref.   ms ms  L Median - APB 30.6 ?31.0  L Ulnar - ADM 29.2 ?32.0  R Median - APB 28.7 ?31.0  R Ulnar - ADM 28.4 ?32.0             EMG       EMG full       EMG Summary Table    Spontaneous MUAP Recruitment  Muscle IA Fib PSW Fasc Other Amp Dur. Poly Pattern  L. Deltoid Normal None None None _______ Normal Normal Normal Normal  L. Triceps brachii Normal None None None _______ Normal Normal Normal Normal  L. Biceps brachii Normal None None None _______ Normal Normal Normal Normal  L. Extensor digitorum communis Normal None None None _______ Normal Increased 1+ Reduced  L. Pronator teres Normal None None None _______ Normal Normal 1+ Normal  L. First dorsal interosseous Normal None None None _______ Normal Normal Normal  Normal  L. Abductor pollicis brevis Normal None None None _______ Normal Normal 1+ Reduced

## 2017-08-24 ENCOUNTER — Other Ambulatory Visit: Payer: Self-pay | Admitting: Neurology

## 2017-08-25 ENCOUNTER — Telehealth: Payer: Self-pay

## 2017-08-25 NOTE — Telephone Encounter (Signed)
Fax received from [facility] (phone# [phone]). Ajovy PA approved thru 10/04/18 under pt's Medicare Part D benefit. PA# [id number]

## 2017-08-25 NOTE — Telephone Encounter (Signed)
I received a prior auth request for ajovy. I have completed and submitted the PA and should have a determination within 48-72 hours.   

## 2017-09-01 ENCOUNTER — Other Ambulatory Visit: Payer: Self-pay | Admitting: Specialist

## 2017-09-01 DIAGNOSIS — R1312 Dysphagia, oropharyngeal phase: Secondary | ICD-10-CM

## 2017-09-06 ENCOUNTER — Other Ambulatory Visit: Payer: Self-pay | Admitting: Neurology

## 2017-09-14 ENCOUNTER — Emergency Department: Payer: Medicare Other

## 2017-09-14 ENCOUNTER — Emergency Department
Admission: EM | Admit: 2017-09-14 | Discharge: 2017-09-14 | Disposition: A | Discharge status: Home / Self Care | Payer: Medicare Other | Attending: Emergency Medicine | Admitting: Emergency Medicine

## 2017-09-14 ENCOUNTER — Encounter: Payer: Self-pay | Admitting: Emergency Medicine

## 2017-09-14 DIAGNOSIS — Z79899 Other long term (current) drug therapy: Secondary | ICD-10-CM | POA: Diagnosis not present

## 2017-09-14 DIAGNOSIS — R197 Diarrhea, unspecified: Secondary | ICD-10-CM | POA: Diagnosis not present

## 2017-09-14 DIAGNOSIS — R109 Unspecified abdominal pain: Secondary | ICD-10-CM | POA: Insufficient documentation

## 2017-09-14 LAB — GASTROINTESTINAL PANEL BY PCR, STOOL (REPLACES STOOL CULTURE)
ADENOVIRUS F40/41: NOT DETECTED
Astrovirus: NOT DETECTED
CRYPTOSPORIDIUM: NOT DETECTED
Campylobacter species: NOT DETECTED
Cyclospora cayetanensis: NOT DETECTED
ENTAMOEBA HISTOLYTICA: NOT DETECTED
ENTEROPATHOGENIC E COLI (EPEC): NOT DETECTED
ENTEROTOXIGENIC E COLI (ETEC): NOT DETECTED
Enteroaggregative E coli (EAEC): NOT DETECTED
GIARDIA LAMBLIA: NOT DETECTED
NOROVIRUS GI/GII: NOT DETECTED
Plesimonas shigelloides: NOT DETECTED
ROTAVIRUS A: NOT DETECTED
Salmonella species: NOT DETECTED
Sapovirus (I, II, IV, and V): DETECTED — AB
Shiga like toxin producing E coli (STEC): NOT DETECTED
Shigella/Enteroinvasive E coli (EIEC): NOT DETECTED
VIBRIO CHOLERAE: NOT DETECTED
VIBRIO SPECIES: NOT DETECTED
Yersinia enterocolitica: NOT DETECTED

## 2017-09-14 LAB — COMPREHENSIVE METABOLIC PANEL
ALK PHOS: 97 U/L (ref 38–126)
ALT: 27 U/L (ref 17–63)
AST: 23 U/L (ref 15–41)
Albumin: 4.3 g/dL (ref 3.5–5.0)
Anion gap: 9 (ref 5–15)
BUN: 11 mg/dL (ref 6–20)
CHLORIDE: 101 mmol/L (ref 101–111)
CO2: 25 mmol/L (ref 22–32)
CREATININE: 0.76 mg/dL (ref 0.61–1.24)
Calcium: 8.7 mg/dL — ABNORMAL LOW (ref 8.9–10.3)
GFR calc Af Amer: >60 mL/min (ref >60)
Glucose, Bld: 92 mg/dL (ref 65–99)
Potassium: 3.5 mmol/L (ref 3.5–5.1)
Sodium: 135 mmol/L (ref 135–145)
Total Bilirubin: 0.8 mg/dL (ref 0.3–1.2)
Total Protein: 7.3 g/dL (ref 6.5–8.1)

## 2017-09-14 LAB — CBC
HCT: 43.9 % (ref 40.0–52.0)
Hemoglobin: 15 g/dL (ref 13.0–18.0)
MCH: 31.4 pg (ref 26.0–34.0)
MCHC: 34.1 g/dL (ref 32.0–36.0)
MCV: 91.9 fL (ref 80.0–100.0)
PLATELETS: 241 10*3/uL (ref 150–440)
RBC: 4.78 MIL/uL (ref 4.40–5.90)
RDW: 12.6 % (ref 11.5–14.5)
WBC: 7.7 10*3/uL (ref 3.8–10.6)

## 2017-09-14 LAB — URINALYSIS, COMPLETE (UACMP) WITH MICROSCOPIC
Bacteria, UA: NONE SEEN
Bilirubin Urine: NEGATIVE
GLUCOSE, UA: NEGATIVE mg/dL
Hgb urine dipstick: NEGATIVE
Ketones, ur: 5 mg/dL — AB
LEUKOCYTES UA: NEGATIVE
Nitrite: NEGATIVE
PH: 6 (ref 5.0–8.0)
Protein, ur: NEGATIVE mg/dL
RBC / HPF: NONE SEEN RBC/hpf (ref 0–5)
SQUAMOUS EPITHELIAL / LPF: NONE SEEN
Specific Gravity, Urine: 1.005 (ref 1.005–1.030)

## 2017-09-14 LAB — C DIFFICILE QUICK SCREEN W PCR REFLEX
C DIFFICILE (CDIFF) INTERP: NOT DETECTED
C DIFFICILE (CDIFF) TOXIN: NEGATIVE
C Diff antigen: NEGATIVE

## 2017-09-14 LAB — LIPASE, BLOOD: LIPASE: 29 U/L (ref 11–51)

## 2017-09-14 MED ORDER — IOPAMIDOL (ISOVUE-300) INJECTION 61%
125.0000 mL | Freq: Once | INTRAVENOUS | Status: AC | PRN
  Administered 2017-09-14: 125 mL via INTRAVENOUS
  Filled 2017-09-14: qty 150

## 2017-09-14 MED ORDER — FENTANYL CITRATE (PF) 100 MCG/2ML IJ SOLN
50.0000 ug | Freq: Once | INTRAMUSCULAR | Status: AC
Start: 2017-09-14 — End: 2017-09-14
  Administered 2017-09-14: 50 ug via INTRAVENOUS

## 2017-09-14 MED ORDER — IOPAMIDOL (ISOVUE-300) INJECTION 61%
30.0000 mL | Freq: Once | INTRAVENOUS | Status: AC | PRN
  Administered 2017-09-14: 30 mL via ORAL
  Filled 2017-09-14: qty 30

## 2017-09-14 MED ORDER — SODIUM CHLORIDE 0.9 % IV BOLUS (SEPSIS)
1000.0000 mL | Freq: Once | INTRAVENOUS | Status: AC
  Administered 2017-09-14: 1000 mL via INTRAVENOUS

## 2017-09-14 MED ORDER — FENTANYL CITRATE (PF) 100 MCG/2ML IJ SOLN
INTRAMUSCULAR | Status: AC
  Filled 2017-09-14: qty 2

## 2017-09-14 NOTE — ED Provider Notes (Addendum)
[facility] Emergency Department Provider Note  ____________________________________________   I have reviewed the triage vital signs and the nursing notes. Where available I have reviewed prior notes and, if possible and indicated, outside hospital notes.    HISTORY  Chief Complaint Abdominal Pain    HPI Parker [name] is a 51 y.o. male who is never had abdominal surgery before, he states for the last month he has had copious recurrent non bloody diarrhea, no vomiting, and he has had a sensation of early satiety.  He also has been having epigastric and left upper quadrant abdominal discomfort.  He denies any fever or chills.  He felt slightly warm however.  He denies any melena or bright red blood per rectum or hematemesis.  The pain is worse with food otherwise nothing makes it better nothing makes it worse.  He has seen his primary care doctor for this and not but given a definitive diagnosis was sent here for further workup.  No recent antibiotics no recent travel.  No recent camping.  Positive sick contacts.   Past Medical History:  Diagnosis Date  . Depression   . Headache   . Hearing loss   . Memory loss   . Multiple sclerosis (HCC)   . Vision abnormalities     Patient Active Problem List   Diagnosis Date Noted  . Carpal tunnel syndrome on left 08/17/2017  . Numbness 07/07/2017  . Chronic migraine without aura 05/12/2016  . Attention deficit 01/09/2016  . Morbid (severe) obesity due to excess calories (HCC) 12/09/2015  . Anxiety 10/11/2015  . Back ache 10/11/2015  . Clinical depression 10/11/2015  . Gait disturbance 10/11/2015  . Visual disturbance 10/11/2015  . Other fatigue 10/11/2015  . Chest pain 07/08/2015  . Combined fat and carbohydrate induced hyperlipemia 07/08/2015  . Common migraine with intractable migraine 07/07/2015  . Mild episode of recurrent major depressive disorder (HCC) 04/12/2015  . Relapsing remitting multiple sclerosis  (HCC) 04/12/2015  . 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 Detrusor muscle hypertonia 03/14/2014  . CN (constipation) 01/09/2014  . FOM (frequency of micturition) 01/09/2014  . Delayed onset of urination 01/09/2014  . Family history of malignant neoplasm of prostate 01/04/2014  . Arthralgia of hip 05/06/2012  . Headache, migraine 07/31/2011  . Vitreous degeneration 07/24/2011  . Multiple sclerosis (HCC) 06/22/2011    Past Surgical History:  Procedure Laterality Date  . COLONOSCOPY WITH PROPOFOL N/A 07/01/2015   Procedure: COLONOSCOPY WITH PROPOFOL;  Surgeon: Christena Deem, MD;  Location: [facility] ENDOSCOPY;  Service: Endoscopy;  Laterality: N/A;  . varicose seals      Prior to Admission medications   Medication Sig Start Date End Date Taking? Authorizing Provider  amantadine (SYMMETREL) 100 MG capsule TAKE ONE CAPSULE BY MOUTH TWICE A DAY 09/06/17   Sater, Pearletha Furl, MD  baclofen (LIORESAL) 20 MG tablet TAKE 1 TABLET BY MOUTH THREE TIMES DAILY 07/27/17   Sater, Pearletha Furl, MD  DULoxetine (CYMBALTA) 60 MG capsule TAKE 1 CAPSULE(60 MG) BY MOUTH DAILY 03/02/17   Sater, Pearletha Furl, MD  gabapentin (NEURONTIN) 600 MG tablet Take 1 tablet (600 mg total) by mouth 3 (three) times daily. 05/27/17   Sater, Pearletha Furl, MD  HYDROcodone-acetaminophen (NORCO/VICODIN) 5-325 MG tablet Take 1 tablet by mouth every 8 (eight) hours as needed for moderate pain. 10/11/15   Sater, Pearletha Furl, MD  levETIRAcetam (KEPPRA) 500 MG tablet One po tid 09/11/16   Sater, Pearletha Furl, MD  modafinil (PROVIGIL) 200 MG tablet  TAKE 1 TABLET BY MOUTH ONCE A DAY  06/29/17   Sater, Pearletha Furlichard A, MD  ocrelizumab 300 mg in sodium chloride 0.9 % 250 mL Inject 300 mg into the vein once.    [provider]  ocrelizumab 600 mg in sodium chloride 0.9 % 500 mL Inject 600 mg into the vein every 6 (six) months.    [provider]  tamsulosin (FLOMAX) 0.4 MG CAPS capsule TAKE 1 CAPSULE(0.4 MG) BY MOUTH DAILY 08/25/17   Sater,  Pearletha Furlichard A, MD    Allergies Glucosamine forte [nutritional supplements]; Sulfa antibiotics; Sulfur; Tapentadol; Clonazepam; Imipramine; and Penicillins  Family History  Problem Relation Age of Onset  . Lung cancer Mother   . Heart disease Father   . Prostate cancer Father   . Diabetes type II Father     Social History Social History   Tobacco Use  . Smoking status: Never Smoker  . Smokeless tobacco: Never Used  Substance Use Topics  . Alcohol use: No  . Drug use: No    Review of Systems Constitutional: No fever/chills Eyes: No visual changes. ENT: No sore throat. No stiff neck no neck pain Cardiovascular: Denies chest pain. Respiratory: Denies shortness of breath. Gastrointestinal:   no vomiting.  + diarrhea.  No constipation. Genitourinary: Negative for dysuria. Musculoskeletal: Negative lower extremity swelling Skin: Negative for rash. Neurological: Negative for severe headaches, focal weakness or numbness.   ____________________________________________   PHYSICAL EXAM:  VITAL SIGNS: ED Triage Vitals [09/14/17 1706]  Enc Vitals Group     BP 114/72     Pulse Rate 71     Resp 18     Temp 98.9 F (37.2 C)     Temp Source Oral     SpO2 100 %     Weight 260 lb (117.9 kg)     Height 5\' 11"  (1.803 m)     Head Circumference      Peak Flow      Pain Score 6     Pain Loc      Pain Edu?      Excl. in GC?     Constitutional: Alert and oriented. Well appearing and in no acute distress. Eyes: Conjunctivae are normal Head: Atraumatic HEENT: No congestion/rhinnorhea. Mucous membranes are moist.  Oropharynx non-erythematous Neck:   Nontender with no meningismus, no masses, no stridor Cardiovascular: Normal rate, regular rhythm. Grossly normal heart sounds.  Good peripheral circulation. Respiratory: Normal respiratory effort.  No retractions. Lungs CTAB. Abdominal: Soft and diffusely tender especially in the left upper quadrant and epigastric region. No  distention. No guarding no rebound Back:  There is no focal tenderness or step off.  there is no midline tenderness there are no lesions noted. there is no CVA tenderness Musculoskeletal: No lower extremity tenderness, no upper extremity tenderness. No joint effusions, no DVT signs strong distal pulses no edema Neurologic:  Normal speech and language. No gross focal neurologic deficits are appreciated.  Skin:  Skin is warm, dry and intact. No rash noted. Psychiatric: Mood and affect are normal. Speech and behavior are normal.  ____________________________________________   LABS (all labs ordered are listed, but only abnormal results are displayed)  Labs Reviewed  COMPREHENSIVE METABOLIC PANEL - Abnormal; Notable for the following components:      Result Value   Calcium 8.7 (*)    All other components within normal limits  URINALYSIS, COMPLETE (UACMP) WITH MICROSCOPIC - Abnormal; Notable for the following components:   Color, Urine YELLOW (*)  APPearance CLEAR (*)    Ketones, ur 5 (*)    All other components within normal limits  C DIFFICILE QUICK SCREEN W PCR REFLEX  GASTROINTESTINAL PANEL BY PCR, STOOL (REPLACES STOOL CULTURE)  LIPASE, BLOOD  CBC    Pertinent labs  results that were available during my care of the patient were reviewed by me and considered in my medical decision making (see chart for details). ____________________________________________  EKG  I personally interpreted any EKGs ordered by me or triage  ____________________________________________  RADIOLOGY  Pertinent labs & imaging results that were available during my care of the patient were reviewed by me and considered in my medical decision making (see chart for details). If possible, patient and/or family made aware of any abnormal findings.  No results found. ____________________________________________    PROCEDURES  Procedure(s) performed: None  Procedures  Critical Care performed:  None  ____________________________________________   INITIAL IMPRESSION / ASSESSMENT AND PLAN / ED COURSE  Pertinent labs & imaging results that were available during my care of the patient were reviewed by me and considered in my medical decision making (see chart for details).  Patient here with diarrhea and abdominal pain, blood work reassuring vital signs reassuring, exam concerning for abdominal discomfort, diverticulitis is on the differential, low suspicion for appendicitis will obtain CT scan to further evaluate, but does provide a stool sample we will send it for culture, very reassuring blood work thus far,  ----------------------------------------- 9:39 PM on 09/14/2017 -----------------------------------------  Considering the patient's symptoms, medical history, and physical examination today, I have low suspicion for cholecystitis or biliary pathology, pancreatitis, perforation or bowel obstruction, hernia, intra-abdominal abscess, AAA or dissection, volvulus or intussusception, mesenteric ischemia, ischemic gut, pyelonephritis or appendicitis.  Patient with a benign abdomen at this time, able to eat and drink in the department, gave him IV fluids, C. difficile and bio fire are pending.  No indication at this time for antibiotics.  Patient to follow-up with primary care will also give GI referral.  We did make him aware of all findings and his CT scan I showed him on CT scan his cysts on his kidney liver and also his kidney stone, these are thought to be benign processes at this time, certainly not contributing to his pain, radiology does not feel that there oncologic, but patient knows he must follow-up closely with primary care.    ____________________________________________   FINAL CLINICAL IMPRESSION(S) / ED DIAGNOSES  Final diagnoses:  None      This chart was dictated using voice recognition software.  Despite best efforts to proofread,  errors can occur which can  change meaning.      Jeanmarie Plant, MD 09/14/17 Angelene Giovanni    Jeanmarie Plant, MD 09/14/17 [id number]

## 2017-09-14 NOTE — ED Triage Notes (Signed)
Patient presents to the ED with abdominal pain since last Tuesday.  Patient states he saw his doctor on Thursday and had blood drawn.  Patient states, "all the blood test results came back fine but over the weekend I've been having a lot of diarrhea.  If I drink a glass of water it feels like I have a full stomach."  [facility] today after getting abdominal xrays.

## 2017-09-14 NOTE — ED Notes (Signed)
Patient transported to CT 

## 2017-09-14 NOTE — ED Notes (Signed)
Discussed patient with Dr. Roxan Hockey who instructed this RN to get abdominal protocol orders at this time.  No radiology at this time.

## 2017-09-14 NOTE — Discharge Instructions (Signed)
Do notice that you have cysts in your liver and in your kidneys, this is most likely benign according to radiologist but please let your primary care doctor know about it.  We also noticed that you have a kidney stone.  This is also not causing your  symptoms today but at one point in your life you may pass a kidney stone on your left and is important to know that this is a possibility.  In addition, we have sent stool cultures, we and your primary care doctor will both strive to follow up with this.  If you do not hear from [place] please call your doctor they can also get these records they should be available tomorrow.  If antibiotics are warranted, generally speaking a physician will call you with a prescription however, just to be on the safe side I would strongly advise that you follow closely with primary care about this.  If you have bleeding from your bottom, significant diarrhea that makes you feel lightheaded significant pain, fever, chills, or you feel worse in any way please return to the emergency department.  You and all family members to be very careful about washing hands to limit the risk of spreading possibly infectious diarrhea.

## 2017-09-20 ENCOUNTER — Ambulatory Visit: Admission: RE | Admit: 2017-09-20 | Payer: Self-pay | Source: Ambulatory Visit

## 2017-09-28 ENCOUNTER — Other Ambulatory Visit: Payer: Self-pay | Admitting: Neurology

## 2017-09-30 ENCOUNTER — Telehealth: Payer: Self-pay | Admitting: *Deleted

## 2017-09-30 DIAGNOSIS — Z0289 Encounter for other administrative examinations: Secondary | ICD-10-CM

## 2017-09-30 NOTE — Telephone Encounter (Signed)
Pt [employer] form on Faith desk. 

## 2017-10-08 NOTE — Telephone Encounter (Signed)
Pt is needing [employer] form asap

## 2017-10-12 ENCOUNTER — Other Ambulatory Visit: Payer: Self-pay | Admitting: Neurology

## 2017-10-12 NOTE — Telephone Encounter (Signed)
Disability forms completed, sent to medical records to be distributed per pt's request/fim

## 2017-10-13 ENCOUNTER — Telehealth: Payer: Self-pay | Admitting: *Deleted

## 2017-10-13 NOTE — Telephone Encounter (Signed)
Pt  form faxed to [employer] on 10/13/17

## 2017-10-20 ENCOUNTER — Other Ambulatory Visit: Payer: Self-pay | Admitting: Neurology

## 2017-12-14 ENCOUNTER — Telehealth: Payer: Self-pay | Admitting: Neurology

## 2017-12-14 MED ORDER — GABAPENTIN 600 MG PO TABS: 600.0000 mg | ORAL_TABLET | Freq: Three times a day (TID) | ORAL | 1 refills | Status: DC

## 2017-12-14 NOTE — Telephone Encounter (Signed)
Gabapentin escribed to [facility] in [place] as requested/fim

## 2017-12-14 NOTE — Telephone Encounter (Signed)
Patient requesting new Rx for gabapentin (NEURONTIN) 600 MG tablet called to [facility] in [place]. He has changed pharmacies.

## 2017-12-14 NOTE — Addendum Note (Signed)
Addended by: Candis Schatz I on: 12/14/2017 01:03 PM   Modules accepted: Orders

## 2018-01-05 ENCOUNTER — Encounter: Payer: Self-pay | Admitting: Neurology

## 2018-01-05 ENCOUNTER — Ambulatory Visit: Payer: Medicare HMO | Admitting: Neurology

## 2018-01-05 ENCOUNTER — Other Ambulatory Visit: Payer: Self-pay

## 2018-01-05 VITALS — BP 121/71 | HR 83 | Resp 18 | Ht 71.0 in | Wt 262.0 lb

## 2018-01-05 DIAGNOSIS — G5602 Carpal tunnel syndrome, left upper limb: Secondary | ICD-10-CM

## 2018-01-05 DIAGNOSIS — M5441 Lumbago with sciatica, right side: Secondary | ICD-10-CM

## 2018-01-05 DIAGNOSIS — Z79899 Other long term (current) drug therapy: Secondary | ICD-10-CM

## 2018-01-05 DIAGNOSIS — G8929 Other chronic pain: Secondary | ICD-10-CM | POA: Insufficient documentation

## 2018-01-05 DIAGNOSIS — M545 Low back pain: Secondary | ICD-10-CM

## 2018-01-05 DIAGNOSIS — R269 Unspecified abnormalities of gait and mobility: Secondary | ICD-10-CM

## 2018-01-05 DIAGNOSIS — G35 Multiple sclerosis: Secondary | ICD-10-CM | POA: Diagnosis not present

## 2018-01-05 DIAGNOSIS — G43709 Chronic migraine without aura, not intractable, without status migrainosus: Secondary | ICD-10-CM

## 2018-01-05 NOTE — Progress Notes (Signed)
[facility]  PATIENT: Parker [name] DOB: 1965/12/22  REFERRING DOCTOR OR PCP:  Dr. Cristopher Peru (fax  [phone]) SOURCE: patient, notes, MRI reports and MRI images on PACS  _________________________________   HISTORICAL  CHIEF COMPLAINT:  Chief Complaint  Patient presents with  . Multiple Sclerosis    Last Ocrevus infusion was on  July 29, 2017. Sts. left carpal tunnel syndrome is bothering him--though brace made it worse, so he doesn't wear it/fim    HISTORY OF PRESENT ILLNESS:  Parker [name] is a 52 y.o. man with multiple sclerosis and chronic migraine   Update 01/05/2018: He feels his MS is stable.   His next infusion should be at the end of the month.      He has aches and pains in the neck and back ans sometimes the chest and legs.   These fluctuate but not necessarily associated with activity.     The lower back pain on the right is the worse.  He notes it more at night in bed.  Thgere is radiation into the buttock.   He takes baclofen 20 mg po tid, gabapentin 600 mg po tid.[name]   He stays active but does not exercise.     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   The splint seemed to aggravate the pain so he no longer uses.      He has daily headaches with migrainous features.   He took Ajovy samples but there was no benefit.   He used to be on Botox for migraine and had a good benefit .   He also has been on multiple other prophylactic agents without benefit including antiepileptic agents, muscle relaxants, NSAIDs and beta-blockers.  Update 07/07/2017:    Earlier this year, he switched from [place] to ocrelizumab due to [place] being removed from the market because of multiple cases of encephalitis. He follow through with the liver testing for the next 6 months and liver tests were fine. He tolerated the ocrelizumab infusion well but had a pneumonia and cough afterwards.   He sees ENT about  this.   He has not had any definite exacerbation though he feels his balance is doing worse.   He also notes more numbness in his arms that is painful.     The numbness sometimes awakens him at night.    This occurs left > right.    He feels weak when the tingling is more severe.      Flomax helps the bladder hesitancy some but not always.    Fatigue fluctuates and is worse with temperature extremes.    He sleeps poorly between the headaches and numbness.     He notes mild depression.     Migraines are still occurring on a daily basis (30/30 days a month for greater than 4 hours a day).  Unfortunately, his co-pay for Botox is very high through his insurance.    We discussed Ajovy, one of the anti-CGRP compounds.  The migraines are associated with throbbing pain that occurs on the right side more than the left. He has not received benefit from a generative medications though he did receive a benefit from Botox in the past. Triptan's did not help to reduce headache pain when present.   ___________________________ From 12/09/2016:  MS:   He has been on [place] since late 2016 and he tolerates it well.  It was just taken off the market due to  encephalitis cases reported.    LFT's have been normal.  He denies any skin symptoms, abdominal symptoms or significant neurologic changes.  His MS appears to be stable. He did not have any exacerbations while on [name]. However, he did not do as well and he was on Betaseron or Gilenya I have reviewed the MRI of the brain dated 05/27/2016 and compared it with the 05/02/2015 MRI. There are no changes in the MRI over the interval and changes are consistent with multiple sclerosis. No acute findings. 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  His headache pain is 8/10.   Pain is throbbing and more often on the right side of his head.  When pain is more severe he will get nausea, photophobia and phonophobia. In the past, Botox was very effective in controlling these headaches.   He has not received much benefit as much from other preventative medicines (see below).   Imitrex and other triptans have not helped reduce headaches.     Chronic migraine prophylactic medications tried and failed:   He has tried and failed antiepileptic (Topiramate, Depakote IV, Keppra), NSAIDs (various), opiates (various), Tricyclics (amitriptyline, nortriptyline), muscle relaxants (tizanidine, baclofen).  Triptans (several different) have not helped relieve the headaches.    Gait/strength/sensation:    He stumbles and has had rare falls. He feels his gait is off more because of balance then due to strength.. He reports numbness in the left > righ side of his body with painful dysesthesias, helped by Lyrica. The dysesthesias are worse in cold weather.  He stopped Ampyra as he is was not getting a benefit in walking.   Legs feel tight at times.    Bladder/bowel: He reports urinary hesitancy is better since starting tamsulosin and he has less nocturia.    He has had constipation and often uses enema.    We discussed using MiraLAX.  Vision: He reports some intermittent blurry vision. He wears glasses.  No diplopia.  Fatigue/Sleep:  He reports physical and cognitive fatigue, worse as the day goes on.   Heat has not greatly affected the fatigue.  Amantadine has not helped much. He was once tried on Ritalin but it did not help his focus and he became irritable so he stopped.   . He has insomnia, sleep maintenance most nights and sleep onset issues some nights..   Mood: He reports depression and anxiety and has had panic attacks. The panic attacks can be worse at night.   He does not think increasing the duloxetine has made much difference.  Cognition:   He reports difficulties with cognition. Specifically he has noted difficulty with short-term memory, reduce  focus and attention, verbal fluency and executive function. .  MS History:   He presented in 2003 with numbness that started in the left foot and then progress to the entire left side over a period of about a month. He was initially worked up but there was not enough evidence to conclusively call him MS. He was referred to [facility] clinic and saw Dr. Caryn Section. 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 2008, he went on Tysabri and stayed on Tysabri for about 4 or 5 years. He tolerated it very well and  felt that he was stable while he was on it. 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 He switched to Gilenya. He more recently moved to this area. Dr. Sherryll Burger retested him and he is JCV Ab high posititve and placed him on [name].     Data personally reviewed the MRI of the brain dated 05/02/2015. It shows multiple white matter lesions, many of them in the periventricular white matter, some radially oriented to the ventricles. There is a focus within the right cerebellar hemisphere, as well. There are no enhancing lesions. The MRI of the cervical spine from the same day shows a normal spinal cord. At C6-C7 there is L > R foraminal narrowing due to degenerative changes  with some encroachment on the left C7 nerve root.  REVIEW OF SYSTEMS: Constitutional: No fevers, chills, sweats, or change in appetite.   He reports fatigue and poor sleep. Eyes: No visual changes, double vision, eye pain Ear, nose and throat: No hearing loss, ear pain, nasal congestion, sore throat Cardiovascular: No chest pain, palpitations Respiratory: No shortness of breath at rest or with exertion.   No wheezes GastrointestinaI: No nausea, vomiting, diarrhea, abdominal pain, fecal incontinence Genitourinary: as above. Musculoskeletal.    he reports pain in  the neck, back and many of his muscles. Integumentary: No rash, pruritus, skin lesions Neurological: as above Psychiatric: Notes depression and anxiety Endocrine: No palpitations, diaphoresis, change in appetite, change in weigh or increased thirst Hematologic/Lymphatic: No anemia, purpura, petechiae. Allergic/Immunologic: No itchy/runny eyes, nasal congestion, recent allergic reactions, rashes  ALLERGIES: Allergies  Allergen Reactions  . Glucosamine Forte [Nutritional Supplements]   . Sulfa Antibiotics   . Sulfur Other (See Comments)    Severe Headaches  . Tapentadol Itching  . Clonazepam Rash  . Imipramine Rash  . Penicillins Rash    HOME MEDICATIONS:  Current Outpatient Medications:  .  amantadine (SYMMETREL) 100 MG capsule, TAKE ONE CAPSULE BY MOUTH TWICE A DAY, Disp: 60 capsule, Rfl: 5 .  baclofen (LIORESAL) 20 MG tablet, TAKE 1 TABLET BY MOUTH THREE TIMES DAILY, Disp: 90 tablet, Rfl: 5 .  DULoxetine (CYMBALTA) 60 MG capsule, TAKE 1 CAPSULE(60 MG) BY MOUTH DAILY, Disp: 90 capsule, Rfl: 3 .  gabapentin (NEURONTIN) 600 MG tablet, Take 1 tablet (600 mg total) by mouth 3 (three) times daily., Disp: 270 tablet, Rfl: 1 .  HYDROcodone-acetaminophen (NORCO/VICODIN) 5-325 MG tablet, Take 1 tablet by mouth every 8 (eight) hours as needed for moderate pain., Disp: 90 tablet, Rfl: 0 .  levETIRAcetam (KEPPRA) 500 MG tablet, One po tid, Disp: 90 tablet, Rfl: 5 .  modafinil (PROVIGIL) 200 MG tablet, TAKE ONE TABLET BY MOUTH ONE TIME DAILY , Disp: 90 tablet, Rfl: 0 .  ocrelizumab 300 mg in sodium chloride 0.9 % 250 mL, Inject 300 mg into the vein once., Disp: , Rfl:  .  ocrelizumab 600 mg in sodium chloride 0.9 % 500 mL, Inject 600 mg into the vein every 6 (six) months., Disp: , Rfl:  .  tamsulosin (FLOMAX) 0.4 MG CAPS capsule, TAKE 1 CAPSULE(0.4 MG) BY MOUTH DAILY, Disp: 30 capsule, Rfl: 4 No current facility-administered medications for this visit.   Facility-Administered Medications  Ordered in Other Visits:  .  gadopentetate dimeglumine (MAGNEVIST) injection 20 mL, 20 mL, Intravenous, Once PRN, Walt Geathers, Pearletha Furl, MD  PAST MEDICAL HISTORY: Past Medical History:  Diagnosis Date  . Depression   . Headache   . Hearing loss   . Memory loss   . Multiple sclerosis (  HCC)   . Vision abnormalities     PAST SURGICAL HISTORY: Past Surgical History:  Procedure Laterality Date  . COLONOSCOPY WITH PROPOFOL N/A 07/01/2015   Procedure: COLONOSCOPY WITH PROPOFOL;  Surgeon: Christena Deem, MD;  Location: [facility] ENDOSCOPY;  Service: Endoscopy;  Laterality: N/A;  . varicose seals      FAMILY HISTORY: Family History  Problem Relation Age of Onset  . Lung cancer Mother   . Heart disease Father   . Prostate cancer Father   . Diabetes type II Father     SOCIAL HISTORY:  Social History   Socioeconomic History  . Marital status: Married    Spouse name: Not on file  . Number of children: Not on file  . Years of education: Not on file  . Highest education level: Not on file  Occupational History  . Not on file  Social Needs  . Financial resource strain: Not on file  . Food insecurity:    Worry: Not on file    Inability: Not on file  . Transportation needs:    Medical: Not on file    Non-medical: Not on file  Tobacco Use  . Smoking status: Never Smoker  . Smokeless tobacco: Never Used  Substance and Sexual Activity  . Alcohol use: No  . Drug use: No  . Sexual activity: Not on file  Lifestyle  . Physical activity:    Days per week: Not on file    Minutes per session: Not on file  . Stress: Not on file  Relationships  . Social connections:    Talks on phone: Not on file    Gets together: Not on file    Attends religious service: Not on file    Active member of club or organization: Not on file    Attends meetings of clubs or organizations: Not on file    Relationship status: Not on file  . Intimate partner violence:    Fear of current or ex partner: Not on  file    Emotionally abused: Not on file    Physically abused: Not on file    Forced sexual activity: Not on file  Other Topics Concern  . Not on file  Social History Narrative  . Not on file     PHYSICAL EXAM  Vitals:   01/05/18 1300  BP: 121/71  Pulse: 83  Resp: 18  Weight: 262 lb (118.8 kg)  Height: 5\' 11"  (1.803 m)    Body mass index is 36.54 kg/m.   General: The patient is well-developed and well-nourished and in no acute distress   Skin: Extremities are without significant edema or rash.  Other:   He has mild occipital tenderness.  Ok ROM   Neurologic Exam  Mental status: The patient is alert and oriented x 3 at the time of the examination. The patient has apparent normal recent and remote memory, with mildly reduced attention span and concentration ability.   Speech is normal.  Cranial nerves: Extraocular muscles are normal.  There is mild reduced facial sensation on the left.  Facial strength and trapezius strength are normal.   No dysarthria is noted.  The tongue is midline, and the patient has symmetric elevation of the soft palate. No obvious hearing deficits are noted.  Motor:  Muscle bulk is normal.   Tone is increased in legs. Strength is  5 / 5 in arms and proximally in legs. Strength was 4+/5 distally in the legs and 4/5 in the APB  muscle on the left   Sensory:   On sensory testing, he has reduced sensation to touch in the left arm and leg and reduced vibration sensation on the right.   There is superimposed further numbness in the left hand over the thenar eminence  Coordination: Cerebellar testing reveals good finger-nose-finger and reduced heel-to-shin bilaterally.  Gait and station: Station is normal.  The gait is wide and spastic.  The tandem gait is poor.  Reflexes: Deep tendon reflexes are symmetric and increased in legs bilaterally.   No ankle clonus.    DIAGNOSTIC DATA (LABS, IMAGING, TESTING) - I reviewed patient records, labs, notes,  testing and imaging myself where available.      ASSESSMENT AND PLAN  Multiple sclerosis (HCC) - Plan: CBC with Differential/Platelet, IgG, IgA, IgM, Hepatic function panel  Carpal tunnel syndrome on left  Chronic migraine without aura without status migrainosus, not intractable  Gait disturbance  Chronic right-sided low back pain with right-sided sciatica - Plan: Ambulatory referral to Physical Therapy  High risk medication use - Plan: CBC with Differential/Platelet, IgG, IgA, IgM, Hepatic function panel     1.   Continue Ocrelizumab for MS.  The next infusion will be in about 3-4 weeks..      2.   He has chronic migraine that has not responded well to multiple prophylactic agents, most recently the anti--CGRP agent Ajovy.   In the past, his migraines were best controlled with Botox.  We will try to get him authorized for this treatment again.   3.   Continue other medications. 4.   Inject the left carpal tunnel with 1 mg Decadron using sterile technique.  He tolerated the injection well.  There were no complications.  If not better, consider referral to surgery  5.   Referral to physical therapy for back pain and exercises.  If not better we would need to check an MRI of the lumbar spine to determine if there is a treatable cause of his radicular pain.   6.   Return in 6 months for an MS follow-up.  Call sooner if there are new or worsening neurologic symptoms per   40 minutes face-to-face office visit with greater than one half the time counseling or coordinating care about his MS and other neurologic issues.  Shevon Sian A. Epimenio Foot, MD, PhD, FAAN Certified in Neurology, Clinical Neurophysiology, Sleep Medicine, Pain Medicine and Neuroimaging Director, [facility]  [facility] [address] [place], [place] [address] [phone]

## 2018-01-06 ENCOUNTER — Telehealth: Payer: Self-pay | Admitting: *Deleted

## 2018-01-06 LAB — HEPATIC FUNCTION PANEL
ALBUMIN: 4.3 g/dL (ref 3.5–5.5)
ALK PHOS: 102 IU/L (ref 39–117)
ALT: 13 IU/L (ref 0–44)
AST: 16 IU/L (ref 0–40)
Bilirubin Total: 0.4 mg/dL (ref 0.0–1.2)
Bilirubin, Direct: 0.11 mg/dL (ref 0.00–0.40)
TOTAL PROTEIN: 6.7 g/dL (ref 6.0–8.5)

## 2018-01-06 LAB — CBC WITH DIFFERENTIAL/PLATELET
Basophils Absolute: 0.1 10*3/uL (ref 0.0–0.2)
Basos: 2 %
EOS (ABSOLUTE): 0.3 10*3/uL (ref 0.0–0.4)
Eos: 5 %
HEMOGLOBIN: 14.2 g/dL (ref 13.0–17.7)
Hematocrit: 41.5 % (ref 37.5–51.0)
IMMATURE GRANS (ABS): 0 10*3/uL (ref 0.0–0.1)
IMMATURE GRANULOCYTES: 0 %
LYMPHS: 23 %
Lymphocytes Absolute: 1.4 10*3/uL (ref 0.7–3.1)
MCH: 31.8 pg (ref 26.6–33.0)
MCHC: 34.2 g/dL (ref 31.5–35.7)
MCV: 93 fL (ref 79–97)
MONOCYTES: 9 %
Monocytes Absolute: 0.5 10*3/uL (ref 0.1–0.9)
NEUTROS ABS: 3.8 10*3/uL (ref 1.4–7.0)
NEUTROS PCT: 61 %
Platelets: 274 10*3/uL (ref 150–379)
RBC: 4.46 x10E6/uL (ref 4.14–5.80)
RDW: 13.7 % (ref 12.3–15.4)
WBC: 6.1 10*3/uL (ref 3.4–10.8)

## 2018-01-06 LAB — IGG, IGA, IGM
IGA/IMMUNOGLOBULIN A, SERUM: 349 mg/dL (ref 90–386)
IGG (IMMUNOGLOBIN G), SERUM: 895 mg/dL (ref 700–1600)
IGM (IMMUNOGLOBULIN M), SRM: 125 mg/dL (ref 20–172)

## 2018-01-06 NOTE — Telephone Encounter (Signed)
-----   Message from Asa Lente, MD sent at 01/06/2018 10:16 AM EDT ----- Please let the patient know that the lab work is fine.

## 2018-01-06 NOTE — Telephone Encounter (Signed)
I spoke with Parker [name] this morning and reviewed below lab results.  He verbalized understanding of same/fim

## 2018-01-24 ENCOUNTER — Other Ambulatory Visit: Payer: Self-pay | Admitting: Neurology

## 2018-01-28 ENCOUNTER — Other Ambulatory Visit: Payer: Self-pay | Admitting: Neurology

## 2018-01-31 ENCOUNTER — Telehealth: Payer: Self-pay | Admitting: *Deleted

## 2018-01-31 ENCOUNTER — Telehealth: Payer: Self-pay | Admitting: Neurology

## 2018-01-31 NOTE — Telephone Encounter (Signed)
Patient requesting refill of modafinil (PROVIGIL) 200 MG tablet. He says [facility] does not have Rx.

## 2018-01-31 NOTE — Telephone Encounter (Signed)
PA for Modafinil 200mg  tablets #30/30 completed via Cover My Meds. Key# [id number]  Dx: MS related fatigue (G35, R53.83).  Tried and failed meds: Amantadine 100mg  bid (not enough relief when used alone, more beneficial with Modafinil), Ritalin (side effects) His insurance policy does not provide coverage for Modafinil for MS related fatigue.  I included ov notes and an Expert Opinion Paper on the Mx of MS related fatigue/fim

## 2018-01-31 NOTE — Telephone Encounter (Signed)
Rx. faxed to [facility]/fim

## 2018-02-02 NOTE — Telephone Encounter (Signed)
Fax received from [employer] phone# [phone] Modafinil approved for dates 10/03/17 thru 10/04/18.  Referral# [id number]./HYI

## 2018-02-16 ENCOUNTER — Ambulatory Visit: Payer: Medicare HMO | Attending: Neurology | Admitting: Physical Therapy

## 2018-02-16 ENCOUNTER — Encounter: Payer: Self-pay | Admitting: Physical Therapy

## 2018-02-16 DIAGNOSIS — R2689 Other abnormalities of gait and mobility: Secondary | ICD-10-CM | POA: Diagnosis present

## 2018-02-16 DIAGNOSIS — M5441 Lumbago with sciatica, right side: Secondary | ICD-10-CM | POA: Diagnosis present

## 2018-02-16 DIAGNOSIS — M6281 Muscle weakness (generalized): Secondary | ICD-10-CM | POA: Diagnosis present

## 2018-02-16 DIAGNOSIS — G8929 Other chronic pain: Secondary | ICD-10-CM

## 2018-02-16 NOTE — Therapy (Addendum)
[facility] [address] [place], [place], [address] Phone: [phone]   Fax:  [phone]  Physical Therapy Evaluation  Patient Details  Name: Parker [name] MRN: [id number] Date of Birth: Aug 22, 1966 Referring Provider: Despina Arias MD   Encounter Date: 02/16/2018  PT End of Session - 02/16/18 1652    Visit Number  1    Number of Visits  17    Date for PT Re-Evaluation  04/18/18    Authorization - Visit Number  1    Authorization - Number of Visits  10    PT Start Time  1516    PT Stop Time  1607    PT Time Calculation (min)  51 min    Activity Tolerance  Patient tolerated treatment well       Past Medical History:  Diagnosis Date  . Depression   . Headache   . Hearing loss   . Memory loss   . Multiple sclerosis (HCC)   . Vision abnormalities     Past Surgical History:  Procedure Laterality Date  . COLONOSCOPY WITH PROPOFOL N/A 07/01/2015   Procedure: COLONOSCOPY WITH PROPOFOL;  Surgeon: Christena Deem, MD;  Location: [facility] ENDOSCOPY;  Service: Endoscopy;  Laterality: N/A;  . varicose seals      There were no vitals filed for this visit.   Subjective Assessment - 02/16/18 1633    Subjective  Pleasant 52 yo male reports to physical therapy with chronic right-sided low back pain with pain in right leg.    Pertinent History  Patient reports that he has had low back pain for years with recent insidious exacerbation for which he saw PCP before referral to PT. Patient feels his symptoms are due to MS. Reports that pain is constant and does not change with position or activity; worse at night. Pt denies radicular symptoms but reports intermittent pain in medial arch of R foot and medial thigh as well as intermittent pain in R hip. Denies b/b, cancer. Use of OTC pain relievers (e.g. advil, tylenol) has not helped; extension-oriented exercise has also not resolved pain. Heat also does not help. Formerly worked in [employer] education officer,  now on disability; moved to [place] two years ago for warmer weather. 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  n/a    How long can you stand comfortably?  n/a    How long can you walk comfortably?  n/a    Diagnostic tests  Pt reports lumbar MRI showing "arthritis" in lumbar spine; C5-C7 DJD; brain MRI consistent with MS    Patient Stated Goals  Eliminate LBP    Currently in Pain?  Yes    Pain Score  5     Pain Location  Back    Pain Orientation  Lower;Right    Pain Descriptors / Indicators  Aching    Pain Radiating Towards  Medial thigh, arch of R foot    Pain Onset  More than a month ago    Pain Frequency  Constant    Aggravating Factors   Worse with sleeping    Pain Relieving Factors  None    Effect of Pain on Daily Activities  Limits sleep    Multiple Pain Sites  Yes         [facility] PT Assessment - 02/16/18 1650      Assessment   Medical Diagnosis  R-sided LBP with sciatica    Referring Provider  Despina Arias MD    Onset Date/Surgical Date  10/05/12    Hand Dominance  Right    Prior Therapy  Yes      Precautions   Precautions  None      Restrictions   Weight Bearing Restrictions  No      Balance Screen   Has the patient fallen in the past 6 months  Yes    How many times?  3-4    Has the patient had a decrease in activity level because of a fear of falling?   No    Is the patient reluctant to leave their home because of a fear of falling?   No      Home Public house manager residence      Prior Function   Level of Independence  Independent    Vocation  On disability    Leisure  Remodeling houses      Cognition   Overall Cognitive Status  Within Functional Limits for tasks assessed         EXAMINATION  PAIN 5/10 current 8/10 worst 5/10 best  Low irritability, chronic, ISQ   POSTURE  Mild flexed-forward posture  GAIT /  MOBILITY   Demonstrates mild reduced step length, reduced stance time over RLE. Independent without AD with gait and transfers.  PROM / AROM / MMT  Lumbar spine: Flex  45 Ext  10 L Side-bend WNL R Side-bend Limited by pain L Rot  WNL R Rot  Limited by pain 75%  Hip: ROM grossly WNL except IR significantly limited bilaterally MMT:  L R Flex  4+ 4- Ext  4+ 4- Abd  4 3+ Add  4+ 4- Glute  4+ 4-  Knee grossly 5/5, AROM WNL  SOFT TISSUE LENGTH HS limited in supine to ~40 deg.  Piriformis WNL No limitations with B SKTC Quadriceps limited B  PALPATION / PAM Incr. Tone noted in paraspinals R>L, R QL, R psoas Hypomobility in L4, L5 with CPA Sacral mobility limited with R rot/compression over R ASIS  NEURO Coordination: grossly intact   SPECIAL TESTS - Supine sign + Extension-rotation R>L - Slump - Prone bent-knee - FABER + FADDIR R - Thigh thrust - Pelvic compression - Pelvic distraction   OUTCOME MEASURES  TEST SCORE INTERPRETATION  FOTO 53    TREATMENT  Pt educated in Figure 4 stretch, supine HS stretch with towel, Lunge stretch for quadriceps with 3 x 60 sec hold; emailed with handout for HEP     PT Education - 02/16/18 1652    Education provided  Yes    Education Details  Exam findings; POC    Person(s) Educated  Patient    Methods  Explanation    Comprehension  Verbalized understanding          PT Long Term Goals - 02/16/18 1731      PT LONG TERM GOAL #1   Title  Patient will demonstrate independence with HEP to improve gross RLE hip strength to 4+/5.    Baseline  HEP given    Time  4    Period  Weeks    Status  New    Target Date  03/16/18      PT LONG TERM GOAL #2   Title  Patient will improve FOTO score to 67 to demonstrate reduced disability and pain.    Baseline  FOTO 53  Time  4    Period  Weeks    Status  New    Target Date  03/16/18      PT LONG TERM GOAL #3   Title  Patient will be able to sleep through the night  without waking due to pain.    Baseline  Sleep interrupted due to pain    Time  4    Period  Weeks    Status  New    Target Date  03/16/18      PT LONG TERM GOAL #4   Title  Patient will report best pain of 3/10 NPS to facilitate improved sleeping and reduced pain with ADLs.    Baseline  5/10 constant    Time  4    Period  Weeks    Status  New    Target Date  03/16/18             Plan - 02/16/18 1654    Clinical Impression Statement  52 yo male presents with chronic right-sided LBP with pain in R medial arch, medial thigh. Pain is constant 5/10 worsening to 8/10 with sleep (supine); no change with position or activity. Other red flags negative including hx cancer, b/b symptoms. Lumbar ROM limited in flexion, ext., R side-bending and R rot; hypomobility noted in L4/L5 and sacral rotation to R. R hip ROM significantly limited in IR bilaterally. R hip strength grossly 4-/5; B knee strength 5/5. FADDIR positive on right; FABER, neurodynamic tests negative. Compression test demonstrates reduced SI mobility on R; Thigh thrust negative. HS length limited (~40 deg in supine) and stretching results in minor relief of pain. Gait analysis shows minor abnormalities including reduced step length and reduced stance time over R. Impression: R-sided LBP with pelvic involvement, neural involvement uncertain. Patient will benefit from skilled physical therapy to reduce pain, normalize gait, and return to PLOF.    History and Personal Factors relevant to plan of care:  MS; depression; arthritis of lumbar spine    Clinical Presentation  Evolving    Clinical Presentation due to:  Pt presents with multiple body structure impairments; depression; MS (stable)    Clinical Decision Making  Moderate    Rehab Potential  Fair    Clinical Impairments Affecting Rehab Potential  + Social support, condition stable / - Chronicity of condition; comorbidities;     PT Frequency  2x / week    PT Duration  8 weeks    PT  Treatment/Interventions  Biofeedback;Electrical Stimulation;Moist Heat;Traction;Ultrasound;Gait training;Therapeutic activities;Therapeutic exercise;Neuromuscular re-education;Balance training;Patient/family education;Manual techniques;Passive range of motion;Energy conservation;Taping;Aquatic Therapy;DME Instruction    PT Next Visit Plan  Manual techniques, stretching, hip/pelvic exam    PT Home Exercise Plan  Figure 4 stretch, supine hamstring towel stretch, lunge quad stretch    Consulted and Agree with Plan of Care  Patient       Patient will benefit from skilled therapeutic intervention in order to improve the following deficits and impairments:  Decreased endurance, Hypomobility, Pain, Decreased activity tolerance, Abnormal gait, Decreased strength, Impaired perceived functional ability, Impaired flexibility  Visit Diagnosis: Chronic right-sided low back pain with right-sided sciatica  Other abnormalities of gait and mobility  Muscle weakness (generalized)     Problem List Patient Active Problem List   Diagnosis Date Noted  . Chronic lower back pain 01/05/2018  . Carpal tunnel syndrome on left 08/17/2017  . Numbness 07/07/2017  . Chronic migraine without aura 05/12/2016  . Attention deficit 01/09/2016  . Morbid (severe) obesity  due to excess calories (HCC) 12/09/2015  . Anxiety 10/11/2015  . Back ache 10/11/2015  . Clinical depression 10/11/2015  . Gait disturbance 10/11/2015  . Visual disturbance 10/11/2015  . Other fatigue 10/11/2015  . Chest pain 07/08/2015  . Combined fat and carbohydrate induced hyperlipemia 07/08/2015  . Common migraine with intractable migraine 07/07/2015  . Mild episode of recurrent major depressive disorder (HCC) 04/12/2015  . Relapsing remitting multiple sclerosis (HCC) 04/12/2015  . 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 Detrusor muscle hypertonia 03/14/2014  . CN (constipation) 01/09/2014  . FOM (frequency of micturition) 01/09/2014   . Delayed onset of urination 01/09/2014  . Family history of malignant neoplasm of prostate 01/04/2014  . Arthralgia of hip 05/06/2012  . Headache, migraine 07/31/2011  . Vitreous degeneration 07/24/2011  . Multiple sclerosis (HCC) 06/22/2011    Cammie Mcgee, PT, DPT # [id number], SPT 02/17/2018, 3:04 PM  [facility] [address] [place], [place], [address] Phone: [phone]   Fax:  [phone]  Name: Syris Brookens MRN: [id number] Date of Birth: December 08, 1965

## 2018-02-17 ENCOUNTER — Encounter: Payer: Self-pay | Admitting: Physical Therapy

## 2018-02-21 ENCOUNTER — Ambulatory Visit: Payer: Medicare HMO | Admitting: Physical Therapy

## 2018-02-21 ENCOUNTER — Encounter: Payer: Self-pay | Admitting: Physical Therapy

## 2018-02-21 DIAGNOSIS — R2689 Other abnormalities of gait and mobility: Secondary | ICD-10-CM

## 2018-02-21 DIAGNOSIS — G8929 Other chronic pain: Secondary | ICD-10-CM

## 2018-02-21 DIAGNOSIS — M6281 Muscle weakness (generalized): Secondary | ICD-10-CM

## 2018-02-21 DIAGNOSIS — M5441 Lumbago with sciatica, right side: Principal | ICD-10-CM

## 2018-02-21 NOTE — Therapy (Signed)
[facility] [address] [place], [place], [address] Phone: [phone]   Fax:  [phone]  Physical Therapy Treatment  Patient Details  Name: Parker [name] MRN: [id number] Date of Birth: 1966/03/28 Referring Provider: Despina Arias MD   Encounter Date: 02/21/2018  PT End of Session - 02/21/18 0919    Visit Number  2    Number of Visits  17    Date for PT Re-Evaluation  04/18/18    Authorization - Visit Number  2    Authorization - Number of Visits  10    PT Start Time  0919    PT Stop Time  1021    PT Time Calculation (min)  62 min    Activity Tolerance  Patient tolerated treatment well       Past Medical History:  Diagnosis Date  . Depression   . Headache   . Hearing loss   . Memory loss   . Multiple sclerosis (HCC)   . Vision abnormalities     Past Surgical History:  Procedure Laterality Date  . COLONOSCOPY WITH PROPOFOL N/A 07/01/2015   Procedure: COLONOSCOPY WITH PROPOFOL;  Surgeon: Christena Deem, MD;  Location: [facility] ENDOSCOPY;  Service: Endoscopy;  Laterality: N/A;  . varicose seals      There were no vitals filed for this visit.  Subjective Assessment - 02/21/18 0918    Subjective  Pt. reports 6/10 R hip pain currently.  Entered PT clinic with moderate R antalgic gait pattern.  Pt. working on remodeling house.      Pertinent History  Patient reports that he has had low back pain for years with recent insidious exacerbation for which he saw PCP before referral to PT. Patient feels his symptoms are due to MS. Reports that pain is constant and does not change with position or activity; worse at night. Pt denies radicular symptoms but reports intermittent pain in medial arch of R foot and medial thigh as well as intermittent pain in R hip. Denies b/b, cancer. Use of OTC pain relievers (e.g. advil, tylenol) has not helped; extension-oriented exercise has also not resolved pain. Heat also does not help. Formerly  worked in Community education officer, now on disability; moved to [place] two years ago for warmer weather. 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  Yes    Pain Score  6     Pain Location  Back    Pain Orientation  Lower;Right    Pain Descriptors / Indicators  Aching    Pain Type  Chronic pain          Treatment:  Manual tx.:   Supine LE/lumbar generalized stretches (18 min.).  Reviewed stretching ex. For HEP.  There.ex.:   See new HEP (core ex. Page #1-2).  Cuing for proper muscle activation of TrA in supine and seated posture.   Ambulate in PT clinic with use of SPC on L (2-point gait pattern) and cuing for proper posture/ step pattern.  Marked improvement in R LE stance phase of gait while using SPC.         PT Long Term Goals - 02/16/18 1731      PT LONG TERM GOAL #1   Title  Patient will demonstrate independence with HEP to improve gross RLE hip strength to 4+/5.    Baseline  HEP given    Time  4    Period  Weeks    Status  New    Target Date  03/16/18      PT LONG TERM GOAL #2   Title  Patient will improve FOTO score to 67 to demonstrate reduced disability and pain.    Baseline  FOTO 53    Time  4    Period  Weeks    Status  New    Target Date  03/16/18      PT LONG TERM GOAL #3   Title  Patient will be able to sleep through the night without waking due to pain.    Baseline  Sleep interrupted due to pain    Time  4    Period  Weeks    Status  New    Target Date  03/16/18      PT LONG TERM GOAL #4   Title  Patient will report best pain of 3/10 NPS to facilitate improved sleeping and reduced pain with ADLs.    Baseline  5/10 constant    Time  4    Period  Weeks    Status  New    Target Date  03/16/18            Plan  - 02/21/18 0919    Clinical Impression Statement  Pt. has good technique with current stretching program and PT progressed pt. with TrA ex./ progression in supine position.  Pt. benefits from use of SPC on L with 2-point gait pattern to deweight R hip/LE and improve gait pattern.  Marked improvement with consistent gait pattern/ improved R LE wt. bearing with use of SPC.  Pain in hip/back persistent t/o tx. session with no worsening reported.      Clinical Presentation  Evolving    Clinical Decision Making  Moderate    Rehab Potential  Fair    Clinical Impairments Affecting Rehab Potential  + Social support, condition stable / - Chronicity of condition; comorbidities;     PT Frequency  2x / week    PT Duration  8 weeks    PT Treatment/Interventions  Biofeedback;Electrical Stimulation;Moist Heat;Traction;Ultrasound;Gait training;Therapeutic activities;Therapeutic exercise;Neuromuscular re-education;Balance training;Patient/family education;Manual techniques;Passive range of motion;Energy conservation;Taping;Aquatic Therapy;DME Instruction    PT Next Visit Plan  Manual techniques/ Reassess SPC use.      PT Home Exercise Plan  Figure 4 stretch, supine hamstring towel stretch, lunge quad stretch    Consulted and Agree with Plan of Care  Patient       Patient will benefit from skilled therapeutic intervention in order to improve the following deficits and impairments:  Decreased endurance, Hypomobility, Pain, Decreased activity tolerance, Abnormal gait, Decreased strength, Impaired perceived functional ability, Impaired flexibility  Visit Diagnosis: Chronic right-sided low back pain with right-sided sciatica  Other abnormalities of gait and mobility  Muscle weakness (generalized)     Problem List Patient Active Problem List   Diagnosis Date Noted  . Chronic lower back pain 01/05/2018  . Carpal tunnel syndrome on left 08/17/2017  . Numbness 07/07/2017  . Chronic migraine without aura  05/12/2016  . Attention deficit 01/09/2016  . Morbid (severe) obesity due to excess calories (HCC) 12/09/2015  . Anxiety 10/11/2015  . Back ache 10/11/2015  . Clinical depression 10/11/2015  . Gait disturbance 10/11/2015  . Visual disturbance 10/11/2015  . Other fatigue 10/11/2015  .  Chest pain 07/08/2015  . Combined fat and carbohydrate induced hyperlipemia 07/08/2015  . Common migraine with intractable migraine 07/07/2015  . Mild episode of recurrent major depressive disorder (HCC) 04/12/2015  . Relapsing remitting multiple sclerosis (HCC) 04/12/2015  . 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 Detrusor muscle hypertonia 03/14/2014  . CN (constipation) 01/09/2014  . FOM (frequency of micturition) 01/09/2014  . Delayed onset of urination 01/09/2014  . Family history of malignant neoplasm of prostate 01/04/2014  . Arthralgia of hip 05/06/2012  . Headache, migraine 07/31/2011  . Vitreous degeneration 07/24/2011  . Multiple sclerosis (HCC) 06/22/2011   Cammie Mcgee, PT, DPT # [id number] 02/21/2018, 3:47 PM  [facility] [address] [place], [place], [address] Phone: [phone]   Fax:  [phone]  Name: Jearld Hemp MRN: [id number] Date of Birth: Feb 22, 1966

## 2018-02-23 ENCOUNTER — Encounter: Payer: Self-pay | Admitting: Physical Therapy

## 2018-02-23 ENCOUNTER — Ambulatory Visit: Payer: Medicare HMO | Admitting: Physical Therapy

## 2018-02-23 DIAGNOSIS — R2689 Other abnormalities of gait and mobility: Secondary | ICD-10-CM

## 2018-02-23 DIAGNOSIS — M5441 Lumbago with sciatica, right side: Principal | ICD-10-CM

## 2018-02-23 DIAGNOSIS — G8929 Other chronic pain: Secondary | ICD-10-CM

## 2018-02-23 DIAGNOSIS — M6281 Muscle weakness (generalized): Secondary | ICD-10-CM

## 2018-02-23 NOTE — Therapy (Signed)
[facility] [address] [place], [place], [address] Phone: [phone]   Fax:  [phone]  Physical Therapy Treatment  Patient Details  Name: Parker [name] MRN: [id number] Date of Birth: 03/09/1966 Referring Provider: Despina Arias MD   Encounter Date: 02/23/2018  PT End of Session - 02/23/18 0931    Visit Number  3    Number of Visits  17    Date for PT Re-Evaluation  04/18/18    Authorization - Visit Number  3    Authorization - Number of Visits  10    PT Start Time  0817    PT Stop Time  0904    PT Time Calculation (min)  47 min    Activity Tolerance  Patient tolerated treatment well;Patient limited by pain       Past Medical History:  Diagnosis Date  . Depression   . Headache   . Hearing loss   . Memory loss   . Multiple sclerosis (HCC)   . Vision abnormalities     Past Surgical History:  Procedure Laterality Date  . COLONOSCOPY WITH PROPOFOL N/A 07/01/2015   Procedure: COLONOSCOPY WITH PROPOFOL;  Surgeon: Christena Deem, MD;  Location: [facility] ENDOSCOPY;  Service: Endoscopy;  Laterality: N/A;  . varicose seals      There were no vitals filed for this visit.  Subjective Assessment - 02/23/18 0911    Subjective  Pt. reports 5/10 R hip/back pain currently.  Pt. states he feels weak in legs due to MS symptoms.  Pt. walked into PT with use of SPC on L and marked improvement in gait pattern/ R hip stance time.      Pertinent History  Patient reports that he has had low back pain for years with recent insidious exacerbation for which he saw PCP before referral to PT. Patient feels his symptoms are due to MS. Reports that pain is constant and does not change with position or activity; worse at night. Pt denies radicular symptoms but reports intermittent pain in medial arch of R foot and medial thigh as well as intermittent pain in R hip. Denies b/b, cancer. Use of OTC pain relievers (e.g. advil, tylenol) has not helped;  extension-oriented exercise has also not resolved pain. Heat also does not help. 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  n/a    How long can you stand comfortably?  n/a    How long can you walk comfortably?  n/a    Diagnostic tests  Pt reports lumbar MRI showing "arthritis" in lumbar spine; C5-C7 DJD; brain MRI consistent with MS    Patient Stated Goals  Eliminate LBP    Currently in Pain?  Yes    Pain Score  5     Pain Location  Back    Pain Orientation  Right;Lower    Pain Descriptors / Indicators  Aching    Pain Type  Chronic pain           Treatment:  Scifit L6 10 min. B UE/LE (warm-up/no charge).   Reassessed gait with use of SPC on L.  Marked increase in R LE stance time.    Manual tx.:   Supine LE/lumbar generalized stretches (14 min.).    There.ex.:   Reviewed HEP (core ex.  Page #1-2).  Cuing for proper muscle activation of TrA in supine and seated posture.   Ambulate in PT clinic with use of SPC on L (2-point gait pattern) and cuing for proper posture/ step pattern.  Marked improvement in R LE stance phase of gait while using SPC.     Supine ball ex. (knee to chest/ bridge/ rotn).   Supine hip abd. With BTB 15x (increase R lateral knee discomfort).       PT Long Term Goals - 02/16/18 1731      PT LONG TERM GOAL #1   Title  Patient will demonstrate independence with HEP to improve gross RLE hip strength to 4+/5.    Baseline  HEP given    Time  4    Period  Weeks    Status  New    Target Date  03/16/18      PT LONG TERM GOAL #2   Title  Patient will improve FOTO score to 67 to demonstrate reduced disability and pain.    Baseline  FOTO 53    Time  4    Period  Weeks    Status  New    Target Date  03/16/18      PT LONG TERM GOAL #3   Title  Patient  will be able to sleep through the night without waking due to pain.    Baseline  Sleep interrupted due to pain    Time  4    Period  Weeks    Status  New    Target Date  03/16/18      PT LONG TERM GOAL #4   Title  Patient will report best pain of 3/10 NPS to facilitate improved sleeping and reduced pain with ADLs.    Baseline  5/10 constant    Time  4    Period  Weeks    Status  New    Target Date  03/16/18        Improvement noted with B hip/LE stretching and flexibility with no significant increase in pain. PT recommends continued use of SPC with all aspects of walking to decrease R hip/back discomfort and promote safety with walking.      Plan - 02/23/18 0931    Clinical Presentation  Evolving    Clinical Decision Making  Moderate    Rehab Potential  Fair    Clinical Impairments Affecting Rehab Potential  + Social support, condition stable / - Chronicity of condition; comorbidities;     PT Frequency  2x / week    PT Duration  8 weeks    PT Treatment/Interventions  Biofeedback;Electrical Stimulation;Moist Heat;Traction;Ultrasound;Gait training;Therapeutic activities;Therapeutic exercise;Neuromuscular re-education;Balance training;Patient/family education;Manual techniques;Passive range of motion;Energy conservation;Taping;Aquatic Therapy;DME Instruction    PT Next Visit Plan  Manual techniques/ progress HEP.    PT Home Exercise Plan  Figure 4 stretch, supine hamstring towel stretch, lunge quad stretch       Patient will benefit from skilled therapeutic intervention in order to improve the following deficits and impairments:  Decreased endurance, Hypomobility, Pain, Decreased activity tolerance, Abnormal gait, Decreased strength, Impaired perceived functional ability, Impaired flexibility  Visit Diagnosis: Chronic right-sided low back pain with right-sided sciatica  Other abnormalities of gait and mobility  Muscle weakness (generalized)     Problem List Patient Active  Problem List   Diagnosis Date Noted  . Chronic lower back pain 01/05/2018  . Carpal tunnel syndrome on left 08/17/2017  . Numbness 07/07/2017  . Chronic migraine without aura  05/12/2016  . Attention deficit 01/09/2016  . Morbid (severe) obesity due to excess calories (HCC) 12/09/2015  . Anxiety 10/11/2015  . Back ache 10/11/2015  . Clinical depression 10/11/2015  . Gait disturbance 10/11/2015  . Visual disturbance 10/11/2015  . Other fatigue 10/11/2015  . Chest pain 07/08/2015  . Combined fat and carbohydrate induced hyperlipemia 07/08/2015  . Common migraine with intractable migraine 07/07/2015  . Mild episode of recurrent major depressive disorder (HCC) 04/12/2015  . Relapsing remitting multiple sclerosis (HCC) 04/12/2015  . 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 Detrusor muscle hypertonia 03/14/2014  . CN (constipation) 01/09/2014  . FOM (frequency of micturition) 01/09/2014  . Delayed onset of urination 01/09/2014  . Family history of malignant neoplasm of prostate 01/04/2014  . Arthralgia of hip 05/06/2012  . Headache, migraine 07/31/2011  . Vitreous degeneration 07/24/2011  . Multiple sclerosis (HCC) 06/22/2011   Cammie Mcgee, PT, DPT # [phone] 02/23/2018, 9:33 AM  [facility] [address] [place], [place], [address] Phone: [phone]   Fax:  [phone]  Name: Parker [name] MRN: [id number] Date of Birth: [birth year]

## 2018-03-01 ENCOUNTER — Encounter: Payer: Self-pay | Admitting: Physical Therapy

## 2018-03-01 ENCOUNTER — Ambulatory Visit: Payer: Medicare HMO | Admitting: Physical Therapy

## 2018-03-01 DIAGNOSIS — M5441 Lumbago with sciatica, right side: Secondary | ICD-10-CM | POA: Diagnosis not present

## 2018-03-01 DIAGNOSIS — R2689 Other abnormalities of gait and mobility: Secondary | ICD-10-CM

## 2018-03-01 DIAGNOSIS — G8929 Other chronic pain: Secondary | ICD-10-CM

## 2018-03-01 DIAGNOSIS — M6281 Muscle weakness (generalized): Secondary | ICD-10-CM

## 2018-03-01 NOTE — Therapy (Addendum)
[facility] [address] [place], [place], [address] Phone: [phone]   Fax:  [phone]  Physical Therapy Treatment  Patient Details  Name: Parker [name] MRN: [id number] Date of Birth: 02-06-1966 Referring Provider: Despina Arias MD   Encounter Date: 03/01/2018  PT End of Session - 03/01/18 1121    Visit Number  4    Number of Visits  17    Date for PT Re-Evaluation  04/18/18    Authorization - Visit Number  4    Authorization - Number of Visits  10    PT Start Time  1105    Activity Tolerance  Patient tolerated treatment well;Patient limited by pain    Behavior During Therapy  [facility] for tasks assessed/performed       Past Medical History:  Diagnosis Date  . Depression   . Headache   . Hearing loss   . Memory loss   . Multiple sclerosis (HCC)   . Vision abnormalities     Past Surgical History:  Procedure Laterality Date  . COLONOSCOPY WITH PROPOFOL N/A 07/01/2015   Procedure: COLONOSCOPY WITH PROPOFOL;  Surgeon: Christena Deem, MD;  Location: [facility] ENDOSCOPY;  Service: Endoscopy;  Laterality: N/A;  . varicose seals      There were no vitals filed for this visit.  Subjective Assessment - 03/01/18 1105    Subjective  No c/o R hip/back pain at this time.  Pt. entered PT with use of R wrist splint.  Pt. ambulates with SPC on L with marked improvement in gait pattern/ increase cadence.        Pertinent History  Patient reports that he has had low back pain for years with recent insidious exacerbation for which he saw PCP before referral to PT. Patient feels his symptoms are due to MS. Reports that pain is constant and does not change with position or activity; worse at night. Pt denies radicular symptoms but reports intermittent pain in medial arch of R foot and medial thigh as well as intermittent pain in R hip. Denies b/b, cancer. Use of OTC pain relievers (e.g. advil, tylenol) has not helped; extension-oriented exercise  has also not resolved pain. Heat also does not help. Formerly worked in [employer] education officer, now on disability; moved to [place] two years ago for warmer weather. 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  No/denies              Treatment:  Scifit L6 10 min. B UE/LE (warm-up/no charge).      Resisted gait 2BTB: 5x all 4-planes.  Min. UE assist/ cuing for posture/ step pattern.  Supine core/ball ex. Progression (PT stopped after increase neck pain during bridging).   Seated neck stretches (6 min.).    Manual tx.:   Supine LE/lumbar generalized stretches(13 min.).        Pt. had a sharp increase in neck pain during supine core ex. program, esp. bridging. PT modified ex. with no improvement reported in neck pain. Pt. states he experiences neck pain on occasion and it will go away with rest. No change to HEP at this time.     PT Long Term Goals - 02/16/18 1731  PT LONG TERM GOAL #1   Title  Patient will demonstrate independence with HEP to improve gross RLE hip strength to 4+/5.    Baseline  HEP given    Time  4    Period  Weeks    Status  New    Target Date  03/16/18      PT LONG TERM GOAL #2   Title  Patient will improve FOTO score to 67 to demonstrate reduced disability and pain.    Baseline  FOTO 53    Time  4    Period  Weeks    Status  New    Target Date  03/16/18      PT LONG TERM GOAL #3   Title  Patient will be able to sleep through the night without waking due to pain.    Baseline  Sleep interrupted due to pain    Time  4    Period  Weeks    Status  New    Target Date  03/16/18      PT LONG TERM GOAL #4   Title  Patient will report best pain of 3/10 NPS to facilitate improved sleeping and reduced pain  with ADLs.    Baseline  5/10 constant    Time  4    Period  Weeks    Status  New    Target Date  03/16/18            Plan - 03/01/18 1122    PT Treatment/Interventions  Biofeedback;Electrical Stimulation;Moist Heat;Traction;Ultrasound;Gait training;Therapeutic activities;Therapeutic exercise;Neuromuscular re-education;Balance training;Patient/family education;Manual techniques;Passive range of motion;Energy conservation;Taping;Aquatic Therapy;DME Instruction       Patient will benefit from skilled therapeutic intervention in order to improve the following deficits and impairments:     Visit Diagnosis: Chronic right-sided low back pain with right-sided sciatica  Other abnormalities of gait and mobility  Muscle weakness (generalized)     Problem List Patient Active Problem List   Diagnosis Date Noted  . Chronic lower back pain 01/05/2018  . Carpal tunnel syndrome on left 08/17/2017  . Numbness 07/07/2017  . Chronic migraine without aura 05/12/2016  . Attention deficit 01/09/2016  . Morbid (severe) obesity due to excess calories (HCC) 12/09/2015  . Anxiety 10/11/2015  . Back ache 10/11/2015  . Clinical depression 10/11/2015  . Gait disturbance 10/11/2015  . Visual disturbance 10/11/2015  . Other fatigue 10/11/2015  . Chest pain 07/08/2015  . Combined fat and carbohydrate induced hyperlipemia 07/08/2015  . Common migraine with intractable migraine 07/07/2015  . Mild episode of recurrent major depressive disorder (HCC) 04/12/2015  . Relapsing remitting multiple sclerosis (HCC) 04/12/2015  . 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 Detrusor muscle hypertonia 03/14/2014  . CN (constipation) 01/09/2014  . FOM (frequency of micturition) 01/09/2014  . Delayed onset of urination 01/09/2014  . Family history of malignant neoplasm of prostate 01/04/2014  . Arthralgia of hip 05/06/2012  . Headache, migraine 07/31/2011  . Vitreous degeneration 07/24/2011  . Multiple  sclerosis (HCC) 06/22/2011   Cammie Mcgee, PT, DPT # [phone] 03/01/2018, 11:28 AM  [facility] [address] [place], [place], [address] Phone: [phone]   Fax:  [phone]  Name: Parker [name] MRN: [id number] Date of Birth: 07-26-1966

## 2018-03-03 ENCOUNTER — Ambulatory Visit: Payer: Medicare HMO | Admitting: Physical Therapy

## 2018-03-03 DIAGNOSIS — M5441 Lumbago with sciatica, right side: Secondary | ICD-10-CM | POA: Diagnosis not present

## 2018-03-03 DIAGNOSIS — G8929 Other chronic pain: Secondary | ICD-10-CM

## 2018-03-03 DIAGNOSIS — M6281 Muscle weakness (generalized): Secondary | ICD-10-CM

## 2018-03-03 DIAGNOSIS — R2689 Other abnormalities of gait and mobility: Secondary | ICD-10-CM

## 2018-03-04 NOTE — Therapy (Addendum)
[facility] [address] [place], [place], [address] Phone: [phone]   Fax:  [phone]  Physical Therapy Treatment  Patient Details  Name: Parker [name] MRN: [id number] Date of Birth: Nov 02, 1965 Referring Provider: Despina Arias MD   Encounter Date: 03/03/2018  PT End of Session - 03/06/18 2017    Visit Number  5    Number of Visits  17    Date for PT Re-Evaluation  04/18/18    Authorization - Visit Number  5    Authorization - Number of Visits  10    PT Start Time  1033    PT Stop Time  1125    PT Time Calculation (min)  52 min       Past Medical History:  Diagnosis Date  . Depression   . Headache   . Hearing loss   . Memory loss   . Multiple sclerosis (HCC)   . Vision abnormalities     Past Surgical History:  Procedure Laterality Date  . COLONOSCOPY WITH PROPOFOL N/A 07/01/2015   Procedure: COLONOSCOPY WITH PROPOFOL;  Surgeon: Christena Deem, MD;  Location: [facility] ENDOSCOPY;  Service: Endoscopy;  Laterality: N/A;  . varicose seals      There were no vitals filed for this visit.  Subjective Assessment - 03/06/18 2008    Subjective  Pt. reports 3-4/10 hip pain and states his neck is still feeling stiff, not painful since last PT visit.      Pertinent History  Patient reports that he has had low back pain for years with recent insidious exacerbation for which he saw PCP before referral to PT. Patient feels his symptoms are due to MS. Reports that pain is constant and does not change with position or activity; worse at night. Pt denies radicular symptoms but reports intermittent pain in medial arch of R foot and medial thigh as well as intermittent pain in R hip. Denies b/b, cancer. Use of OTC pain relievers (e.g. advil, tylenol) has not helped; extension-oriented exercise has also not resolved pain. Heat also does not help. Formerly worked in [employer] education officer, now on disability; moved to [place] two years ago for warmer weather.  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  n/a    How long can you stand comfortably?  n/a    How long can you walk comfortably?  n/a    Diagnostic tests  Pt reports lumbar MRI showing "arthritis" in lumbar spine; C5-C7 DJD; brain MRI consistent with MS    Patient Stated Goals  Eliminate LBP    Currently in Pain?  Yes    Pain Score  4     Pain Location  Hip    Pain Orientation  Right    Pain Descriptors / Indicators  Aching    Pain Type  Chronic pain         Treatment:  Scifit L6 10 min. B UE/LE (warm-up/no charge).    Manual tx.:   Supine LE/lumbar generalized stretches(14 min.).  Supine neural glides to L/R LE 3x each.    There.ex.:   Ambulate in PT clinic with use of SPC on L (2-point gait pattern) and cuing for proper posture/ step pattern. Marked improvement in R LE stance phase of gait while using SPC.  Seated piriformis stretches 3x each.  Seated LAQ with neural glides.   Sidelying GTB clamshell 20  each L/R.   Reviewed HEP       PT Long Term Goals - 02/16/18 1731      PT LONG TERM GOAL #1   Title  Patient will demonstrate independence with HEP to improve gross RLE hip strength to 4+/5.    Baseline  HEP given    Time  4    Period  Weeks    Status  New    Target Date  03/16/18      PT LONG TERM GOAL #2   Title  Patient will improve FOTO score to 67 to demonstrate reduced disability and pain.    Baseline  FOTO 53    Time  4    Period  Weeks    Status  New    Target Date  03/16/18      PT LONG TERM GOAL #3   Title  Patient will be able to sleep through the night without waking due to pain.    Baseline  Sleep interrupted due to pain    Time  4    Period  Weeks    Status  New    Target Date  03/16/18      PT LONG TERM GOAL #4   Title  Patient will report best pain of 3/10 NPS to facilitate improved sleeping and reduced pain with ADLs.     Baseline  5/10 constant    Time  4    Period  Weeks    Status  New    Target Date  03/16/18         Plan - 03/06/18 2018    Clinical Impression Statement  No increase c/o pain during tx. session with marked improvement in gait pattern with use of SPC.   PT encouraged pt. to remain active with daily walking/ HEP.  Pt. benefits from stretching of piriformis/ ITB to increase flexibility of hip and decrease LBP.      Clinical Presentation  Evolving    Clinical Decision Making  Moderate    Rehab Potential  Fair    Clinical Impairments Affecting Rehab Potential  + Social support, condition stable / - Chronicity of condition; comorbidities;     PT Frequency  2x / week    PT Duration  8 weeks    PT Treatment/Interventions  Biofeedback;Electrical Stimulation;Moist Heat;Traction;Ultrasound;Gait training;Therapeutic activities;Therapeutic exercise;Neuromuscular re-education;Balance training;Patient/family education;Manual techniques;Passive range of motion;Energy conservation;Taping;Aquatic Therapy;DME Instruction    PT Next Visit Plan  Manual techniques/ progress HEP.   Issue new HEP.      PT Home Exercise Plan  Figure 4 stretch, supine hamstring towel stretch, lunge quad stretch       Patient will benefit from skilled therapeutic intervention in order to improve the following deficits and impairments:  Decreased endurance, Hypomobility, Pain, Decreased activity tolerance, Abnormal gait, Decreased strength, Impaired perceived functional ability, Impaired flexibility  Visit Diagnosis: Chronic right-sided low back pain with right-sided sciatica  Other abnormalities of gait and mobility  Muscle weakness (generalized)     Problem List Patient Active Problem List   Diagnosis Date Noted  . Chronic lower back pain 01/05/2018  . Carpal tunnel syndrome on left 08/17/2017  . Numbness 07/07/2017  . Chronic migraine without aura 05/12/2016  . Attention deficit 01/09/2016  . Morbid (severe)  obesity due to excess calories (HCC) 12/09/2015  . Anxiety 10/11/2015  . Back ache 10/11/2015  . Clinical depression 10/11/2015  . Gait disturbance 10/11/2015  . Visual disturbance 10/11/2015  . Other fatigue 10/11/2015  .  Chest pain 07/08/2015  . Combined fat and carbohydrate induced hyperlipemia 07/08/2015  . Common migraine with intractable migraine 07/07/2015  . Mild episode of recurrent major depressive disorder (HCC) 04/12/2015  . Relapsing remitting multiple sclerosis (HCC) 04/12/2015  . 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 Detrusor muscle hypertonia 03/14/2014  . CN (constipation) 01/09/2014  . FOM (frequency of micturition) 01/09/2014  . Delayed onset of urination 01/09/2014  . Family history of malignant neoplasm of prostate 01/04/2014  . Arthralgia of hip 05/06/2012  . Headache, migraine 07/31/2011  . Vitreous degeneration 07/24/2011  . Multiple sclerosis (HCC) 06/22/2011   Cammie Mcgee, PT, DPT # [phone] 03/06/2018, 8:21 PM  [facility] [address] [place], [place], [address] Phone: [phone]   Fax:  [phone]  Name: Parker [name] MRN: [id number] Date of Birth: [birth year]

## 2018-03-07 ENCOUNTER — Ambulatory Visit: Payer: Medicare HMO | Attending: Neurology | Admitting: Physical Therapy

## 2018-03-07 ENCOUNTER — Encounter: Payer: Self-pay | Admitting: Physical Therapy

## 2018-03-07 DIAGNOSIS — R2689 Other abnormalities of gait and mobility: Secondary | ICD-10-CM

## 2018-03-07 DIAGNOSIS — M5441 Lumbago with sciatica, right side: Secondary | ICD-10-CM | POA: Diagnosis present

## 2018-03-07 DIAGNOSIS — M6281 Muscle weakness (generalized): Secondary | ICD-10-CM | POA: Diagnosis present

## 2018-03-07 DIAGNOSIS — G8929 Other chronic pain: Secondary | ICD-10-CM | POA: Diagnosis present

## 2018-03-07 NOTE — Therapy (Signed)
[facility] [address] [place], [place], [address] Phone: [phone]   Fax:  [phone]  Physical Therapy Treatment  Patient Details  Name: Parker [name] MRN: [id number] Date of Birth: 1966-06-09 Referring Provider: Despina Arias MD   Encounter Date: 03/07/2018  PT End of Session - 03/07/18 1034    Visit Number  6    Number of Visits  17    Date for PT Re-Evaluation  04/18/18    Authorization - Visit Number  6    Authorization - Number of Visits  10    PT Start Time  1030    PT Stop Time  1123    PT Time Calculation (min)  53 min    Activity Tolerance  Patient tolerated treatment well;Patient limited by pain    Behavior During Therapy  [facility] for tasks assessed/performed       Past Medical History:  Diagnosis Date  . Depression   . Headache   . Hearing loss   . Memory loss   . Multiple sclerosis (HCC)   . Vision abnormalities     Past Surgical History:  Procedure Laterality Date  . COLONOSCOPY WITH PROPOFOL N/A 07/01/2015   Procedure: COLONOSCOPY WITH PROPOFOL;  Surgeon: Christena Deem, MD;  Location: [facility] ENDOSCOPY;  Service: Endoscopy;  Laterality: N/A;  . varicose seals      There were no vitals filed for this visit.  Subjective Assessment - 03/07/18 1033    Subjective  Pt. entered PT while carrying SPC with marked improvement in normalized gait.  Pt. reports he still has the pain in R back/hip (5/10).      Pertinent History  Patient reports that he has had low back pain for years with recent insidious exacerbation for which he saw PCP before referral to PT. Patient feels his symptoms are due to MS. Reports that pain is constant and does not change with position or activity; worse at night. Pt denies radicular symptoms but reports intermittent pain in medial arch of R foot and medial thigh as well as intermittent pain in R hip. Denies b/b, cancer. Use of OTC pain relievers (e.g. advil, tylenol) has not helped;  extension-oriented exercise has also not resolved pain. Heat also does not help. Formerly worked in [employer] education officer, now on disability; moved to [place] two years ago for warmer weather. 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  n/a    How long can you stand comfortably?  n/a    How long can you walk comfortably?  n/a    Diagnostic tests  Pt reports lumbar MRI showing "arthritis" in lumbar spine; C5-C7 DJD; brain MRI consistent with MS    Patient Stated Goals  Eliminate LBP    Currently in Pain?  Yes    Pain Score  5     Pain Location  Hip    Pain Orientation  Right    Pain Descriptors / Indicators  Aching         Treatment:  Scifit L6.5 10 min. B UE/LE (warm-up/no charge).   Manual tx.:   Supine LE/lumbar generalized stretches(10 min.).  Supine neural glides to L/R LE 3x each.    There.ex.:   Standing GTB (lateral steps/ hip flexion/ hip extension)- 20x.   Seated piriformis stretches 3x each.   See new HEP Seated blue ball: pelvic clocks/ marching/ LAQ (light UE  assist on //-bars required for safety).  Pain limited in R SI/low back during pelvic clocks.         PT Long Term Goals - 02/16/18 1731      PT LONG TERM GOAL #1   Title  Patient will demonstrate independence with HEP to improve gross RLE hip strength to 4+/5.    Baseline  HEP given    Time  4    Period  Weeks    Status  New    Target Date  03/16/18      PT LONG TERM GOAL #2   Title  Patient will improve FOTO score to 67 to demonstrate reduced disability and pain.    Baseline  FOTO 53    Time  4    Period  Weeks    Status  New    Target Date  03/16/18      PT LONG TERM GOAL #3   Title  Patient will be able to sleep through the night without waking due to pain.    Baseline  Sleep interrupted due to pain    Time  4    Period  Weeks    Status  New    Target Date  03/16/18      PT LONG TERM GOAL #4    Title  Patient will report best pain of 3/10 NPS to facilitate improved sleeping and reduced pain with ADLs.    Baseline  5/10 constant    Time  4    Period  Weeks    Status  New    Target Date  03/16/18            Plan - 03/07/18 1034    Clinical Impression Statement  Pt. ambulates with marked improvement in gait pattern without using SPC.  Pt. states R posterior hip/ low back pain remains persistent even though his gait pattern has really improved.  Pt. reports compliance with HEP and staying active with household chores/ home repair tasks.       Clinical Presentation  Evolving    Clinical Decision Making  Moderate    Rehab Potential  Fair    Clinical Impairments Affecting Rehab Potential  + Social support, condition stable / - Chronicity of condition; comorbidities;     PT Frequency  2x / week    PT Duration  8 weeks    PT Treatment/Interventions  Biofeedback;Electrical Stimulation;Moist Heat;Traction;Ultrasound;Gait training;Therapeutic activities;Therapeutic exercise;Neuromuscular re-education;Balance training;Patient/family education;Manual techniques;Passive range of motion;Energy conservation;Taping;Aquatic Therapy;DME Instruction    PT Next Visit Plan  Manual techniques/ progress HEP.       PT Home Exercise Plan  Figure 4 stretch, supine hamstring towel stretch, lunge quad stretch    Consulted and Agree with Plan of Care  Patient       Patient will benefit from skilled therapeutic intervention in order to improve the following deficits and impairments:  Decreased endurance, Hypomobility, Pain, Decreased activity tolerance, Abnormal gait, Decreased strength, Impaired perceived functional ability, Impaired flexibility  Visit Diagnosis: Chronic right-sided low back pain with right-sided sciatica  Other abnormalities of gait and mobility  Muscle weakness (generalized)     Problem List Patient Active Problem List   Diagnosis Date Noted  . Chronic lower back pain  01/05/2018  . Carpal tunnel syndrome on left 08/17/2017  . Numbness 07/07/2017  . Chronic migraine without aura 05/12/2016  . Attention deficit 01/09/2016  . Morbid (severe) obesity due to excess calories (HCC) 12/09/2015  . Anxiety 10/11/2015  .  Back ache 10/11/2015  . Clinical depression 10/11/2015  . Gait disturbance 10/11/2015  . Visual disturbance 10/11/2015  . Other fatigue 10/11/2015  . Chest pain 07/08/2015  . Combined fat and carbohydrate induced hyperlipemia 07/08/2015  . Common migraine with intractable migraine 07/07/2015  . Mild episode of recurrent major depressive disorder (HCC) 04/12/2015  . Relapsing remitting multiple sclerosis (HCC) 04/12/2015  . 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 Detrusor muscle hypertonia 03/14/2014  . CN (constipation) 01/09/2014  . FOM (frequency of micturition) 01/09/2014  . Delayed onset of urination 01/09/2014  . Family history of malignant neoplasm of prostate 01/04/2014  . Arthralgia of hip 05/06/2012  . Headache, migraine 07/31/2011  . Vitreous degeneration 07/24/2011  . Multiple sclerosis (HCC) 06/22/2011   Cammie Mcgee, PT, DPT # [phone] 03/07/2018, 1:38 PM  [facility] [address] [place], [place], [address] Phone: [phone]   Fax:  [phone]  Name: Averey Trompeter MRN: [id number] Date of Birth: Dec 20, 1965

## 2018-03-09 ENCOUNTER — Ambulatory Visit: Payer: Medicare HMO | Admitting: Physical Therapy

## 2018-03-09 DIAGNOSIS — M5441 Lumbago with sciatica, right side: Principal | ICD-10-CM

## 2018-03-09 DIAGNOSIS — M6281 Muscle weakness (generalized): Secondary | ICD-10-CM

## 2018-03-09 DIAGNOSIS — G8929 Other chronic pain: Secondary | ICD-10-CM

## 2018-03-09 DIAGNOSIS — R2689 Other abnormalities of gait and mobility: Secondary | ICD-10-CM

## 2018-03-11 NOTE — Therapy (Addendum)
[facility] [address] [place], [place], [address] Phone: [phone]   Fax:  [phone]  Physical Therapy Treatment  Patient Details  Name: Parker [name] MRN: [id number] Date of Birth: 1966/07/16 Referring Provider: Despina Arias MD   Encounter Date: 03/09/2018  PT End of Session - 03/13/18 2015    Visit Number  7    Number of Visits  17    Date for PT Re-Evaluation  04/18/18    Authorization - Visit Number  7    Authorization - Number of Visits  10    PT Start Time  1027    PT Stop Time  1125    PT Time Calculation (min)  58 min    Activity Tolerance  Patient tolerated treatment well;Patient limited by pain    Behavior During Therapy  [facility] for tasks assessed/performed       Past Medical History:  Diagnosis Date  . Depression   . Headache   . Hearing loss   . Memory loss   . Multiple sclerosis (HCC)   . Vision abnormalities     Past Surgical History:  Procedure Laterality Date  . COLONOSCOPY WITH PROPOFOL N/A 07/01/2015   Procedure: COLONOSCOPY WITH PROPOFOL;  Surgeon: Christena Deem, MD;  Location: [facility] ENDOSCOPY;  Service: Endoscopy;  Laterality: N/A;  . varicose seals      There were no vitals filed for this visit.  Subjective Assessment - 03/13/18 2006    Subjective  Pt. entered PT with reports of persistent R hip/back pain.  Pt. states steroids are not helping at all.  Pt. is wearing R wrist brace today and reports continued pain.      Pertinent History  Patient reports that he has had low back pain for years with recent insidious exacerbation for which he saw PCP before referral to PT. Patient feels his symptoms are due to MS. Reports that pain is constant and does not change with position or activity; worse at night. Pt denies radicular symptoms but reports intermittent pain in medial arch of R foot and medial thigh as well as intermittent pain in R hip. Denies b/b, cancer. Use of OTC pain relievers (e.g.  advil, tylenol) has not helped; extension-oriented exercise has also not resolved pain. Heat also does not help. Formerly worked in [employer] education officer, now on disability; moved to [place] two years ago for warmer weather. 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  Yes    Pain Score  5     Pain Location  Hip    Pain Orientation  Right    Pain Descriptors / Indicators  Aching    Pain Type  Chronic pain    Pain Frequency  Constant          Treatment:  Scifit L6.5 10 min. B UE/LE (warm-up/no charge).   Manual tx.:   Supine LE/lumbar generalized stretches(10 min.). Supine neural glides to L/R LE 3x each.  There.ex.:   Standing GTB (lateral steps/ hip flexion/ hip extension)- 20x.   Monster walks with GTB Partial squats at //-bars 20x. Reviewed lifting/ body mechanics Reviewed new HEP        PT Long Term Goals - 02/16/18 1731  PT LONG TERM GOAL #1   Title  Patient will demonstrate independence with HEP to improve gross RLE hip strength to 4+/5.    Baseline  HEP given    Time  4    Period  Weeks    Status  New    Target Date  03/16/18      PT LONG TERM GOAL #2   Title  Patient will improve FOTO score to 67 to demonstrate reduced disability and pain.    Baseline  FOTO 53    Time  4    Period  Weeks    Status  New    Target Date  03/16/18      PT LONG TERM GOAL #3   Title  Patient will be able to sleep through the night without waking due to pain.    Baseline  Sleep interrupted due to pain    Time  4    Period  Weeks    Status  New    Target Date  03/16/18      PT LONG TERM GOAL #4   Title  Patient will report best pain of 3/10 NPS to facilitate improved sleeping and reduced pain with ADLs.    Baseline  5/10  constant    Time  4    Period  Weeks    Status  New    Target Date  03/16/18            Plan - 03/13/18 2016    Clinical Impression Statement  Pt. continues to with improved gait pattern with and without use of SPC.  Pt. limited by high levels of R hip/back pain with all mobility tasks/ ther.ex.  No decrease in R hip pain during/ after manual stretches.  Pt instructed to remain compliant with daily stretches and core strengthening.  PT reviewed importance of proper body mechanics with home remodeling projects.       Clinical Presentation  Evolving    Clinical Decision Making  Moderate    Rehab Potential  Fair    Clinical Impairments Affecting Rehab Potential  + Social support, condition stable / - Chronicity of condition; comorbidities;     PT Frequency  2x / week    PT Duration  8 weeks    PT Treatment/Interventions  Biofeedback;Electrical Stimulation;Moist Heat;Traction;Ultrasound;Gait training;Therapeutic activities;Therapeutic exercise;Neuromuscular re-education;Balance training;Patient/family education;Manual techniques;Passive range of motion;Energy conservation;Taping;Aquatic Therapy;DME Instruction    PT Next Visit Plan  Manual techniques/ progress HEP.       PT Home Exercise Plan  Figure 4 stretch, supine hamstring towel stretch, lunge quad stretch    Consulted and Agree with Plan of Care  Patient       Patient will benefit from skilled therapeutic intervention in order to improve the following deficits and impairments:  Decreased endurance, Hypomobility, Pain, Decreased activity tolerance, Abnormal gait, Decreased strength, Impaired perceived functional ability, Impaired flexibility  Visit Diagnosis: Chronic right-sided low back pain with right-sided sciatica  Other abnormalities of gait and mobility  Muscle weakness (generalized)     Problem List Patient Active Problem List   Diagnosis Date Noted  . Chronic lower back pain 01/05/2018  . Carpal tunnel syndrome on  left 08/17/2017  . Numbness 07/07/2017  . Chronic migraine without aura 05/12/2016  . Attention deficit 01/09/2016  . Morbid (severe) obesity due to excess calories (HCC) 12/09/2015  . Anxiety 10/11/2015  . Back ache 10/11/2015  . Clinical depression 10/11/2015  . Gait disturbance 10/11/2015  . Visual disturbance 10/11/2015  .  Other fatigue 10/11/2015  . Chest pain 07/08/2015  . Combined fat and carbohydrate induced hyperlipemia 07/08/2015  . Common migraine with intractable migraine 07/07/2015  . Mild episode of recurrent major depressive disorder (HCC) 04/12/2015  . Relapsing remitting multiple sclerosis (HCC) 04/12/2015  . 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 Detrusor muscle hypertonia 03/14/2014  . CN (constipation) 01/09/2014  . FOM (frequency of micturition) 01/09/2014  . Delayed onset of urination 01/09/2014  . Family history of malignant neoplasm of prostate 01/04/2014  . Arthralgia of hip 05/06/2012  . Headache, migraine 07/31/2011  . Vitreous degeneration 07/24/2011  . Multiple sclerosis (HCC) 06/22/2011   Cammie Mcgee, PT, DPT # [phone] 03/14/2018, 7:26 AM  [facility] [address] [place], [place], [address] Phone: [phone]   Fax:  [phone]  Name: Parker [name] MRN: [id number] Date of Birth: Feb 20, 1966

## 2018-03-14 ENCOUNTER — Ambulatory Visit: Payer: Medicare HMO | Admitting: Physical Therapy

## 2018-03-14 ENCOUNTER — Encounter: Payer: Self-pay | Admitting: Physical Therapy

## 2018-03-14 DIAGNOSIS — M6281 Muscle weakness (generalized): Secondary | ICD-10-CM

## 2018-03-14 DIAGNOSIS — M5441 Lumbago with sciatica, right side: Secondary | ICD-10-CM | POA: Diagnosis not present

## 2018-03-14 DIAGNOSIS — R2689 Other abnormalities of gait and mobility: Secondary | ICD-10-CM

## 2018-03-14 DIAGNOSIS — G8929 Other chronic pain: Secondary | ICD-10-CM

## 2018-03-14 NOTE — Therapy (Signed)
[facility] [address] [place], [place], [address] Phone: [phone]   Fax:  [phone]  Physical Therapy Treatment  Patient Details  Name: Parker [name] MRN: [id number] Date of Birth: Jun 26, 1966 Referring Provider: Despina Arias MD   Encounter Date: 03/14/2018  PT End of Session - 03/14/18 1017    Visit Number  8    Number of Visits  17    Date for PT Re-Evaluation  04/18/18    Authorization - Visit Number  8    Authorization - Number of Visits  10    PT Start Time  1017    PT Stop Time  1111    PT Time Calculation (min)  54 min    Activity Tolerance  Patient tolerated treatment well;Patient limited by pain    Behavior During Therapy  [facility] for tasks assessed/performed       Past Medical History:  Diagnosis Date  . Depression   . Headache   . Hearing loss   . Memory loss   . Multiple sclerosis (HCC)   . Vision abnormalities     Past Surgical History:  Procedure Laterality Date  . COLONOSCOPY WITH PROPOFOL N/A 07/01/2015   Procedure: COLONOSCOPY WITH PROPOFOL;  Surgeon: Christena Deem, MD;  Location[facility] ENDOSCOPY;  Service: Endoscopy;  Laterality: N/A;  . varicose seals      There were no vitals filed for this visit.  Subjective Assessment - 03/14/18 1016    Subjective  Pt. remains frustrated with constant R low back pain.  Pt. states he is happy with the improvement in his walking but wakes up several times at night due to R back/hip pain.      Pertinent History  Patient reports that he has had low back pain for years with recent insidious exacerbation for which he saw PCP before referral to PT. Patient feels his symptoms are due to MS. Reports that pain is constant and does not change with position or activity; worse at night. Pt denies radicular symptoms but reports intermittent pain in medial arch of R foot and medial thigh as well as intermittent pain in R hip. Denies b/b, cancer. Use of OTC pain relievers  (e.g. advil, tylenol) has not helped; extension-oriented exercise has also not resolved pain. Heat also does not help. Formerly worked in [employer] education officer, now on disability; moved to [place] two years ago for warmer weather. 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  n/a    How long can you stand comfortably?  n/a    How long can you walk comfortably?  n/a    Diagnostic tests  Pt reports lumbar MRI showing "arthritis" in lumbar spine; C5-C7 DJD; brain MRI consistent with MS    Patient Stated Goals  Eliminate LBP    Currently in Pain?  Yes    Pain Score  7     Pain Location  Hip    Pain Orientation  Right    Pain Descriptors / Indicators  Aching    Pain Type  Chronic pain          Treatment:   Manual tx.:   Supine LE/lumbar generalized stretches(67min.). Supine neural glides to L/R LE 3x each. Prone quad/ hip flexor stretches 3x each. Prone grade II-III PA central/ unilateral mobs. 2x20 sec. (low thoracic/lumbar region). STM to lumbar B paraspinals/ SI region. (+) R SI  discomfort.   Supine L/R LAD 2x30 sec.    Scifit L6.510 min. B UE/LE (warm-up/no charge).     PT Long Term Goals - 02/16/18 1731      PT LONG TERM GOAL #1   Title  Patient will demonstrate independence with HEP to improve gross RLE hip strength to 4+/5.    Baseline  HEP given    Time  4    Period  Weeks    Status  New    Target Date  03/16/18      PT LONG TERM GOAL #2   Title  Patient will improve FOTO score to 67 to demonstrate reduced disability and pain.    Baseline  FOTO 53    Time  4    Period  Weeks    Status  New    Target Date  03/16/18      PT LONG TERM GOAL #3   Title  Patient will be able to sleep through the night without waking due to pain.    Baseline  Sleep interrupted due to pain    Time  4    Period  Weeks    Status  New    Target Date  03/16/18      PT LONG TERM GOAL  #4   Title  Patient will report best pain of 3/10 NPS to facilitate improved sleeping and reduced pain with ADLs.    Baseline  5/10 constant    Time  4    Period  Weeks    Status  New    Target Date  03/16/18        Plan - 03/14/18 1017    Clinical Impression Statement  PT focused entire tx. on LE stretches/ manual mobs. to lumbar spine/ SI joint.  Moderate R SI tenderness with palpation.  Good lumbar mobility with PA mobs. with no c/o increase pain or radicular symptoms.  PT unable to alleviate or even decrease back pain symptoms during manual tx./ LAD/ STM.      Clinical Presentation  Evolving    Clinical Decision Making  Moderate    Rehab Potential  Fair    Clinical Impairments Affecting Rehab Potential  + Social support, condition stable / - Chronicity of condition; comorbidities;     PT Frequency  2x / week    PT Duration  8 weeks    PT Treatment/Interventions  Biofeedback;Electrical Stimulation;Moist Heat;Traction;Ultrasound;Gait training;Therapeutic activities;Therapeutic exercise;Neuromuscular re-education;Balance training;Patient/family education;Manual techniques;Passive range of motion;Energy conservation;Taping;Aquatic Therapy;DME Instruction    PT Next Visit Plan  Manual techniques/ progress HEP.       PT Home Exercise Plan  Figure 4 stretch, supine hamstring towel stretch, lunge quad stretch    Consulted and Agree with Plan of Care  Patient       Patient will benefit from skilled therapeutic intervention in order to improve the following deficits and impairments:  Decreased endurance, Hypomobility, Pain, Decreased activity tolerance, Abnormal gait, Decreased strength, Impaired perceived functional ability, Impaired flexibility  Visit Diagnosis: Chronic right-sided low back pain with right-sided sciatica  Other abnormalities of gait and mobility  Muscle weakness (generalized)     Problem List Patient Active Problem List   Diagnosis Date Noted  . Chronic lower  back pain 01/05/2018  . Carpal tunnel syndrome on left 08/17/2017  . Numbness 07/07/2017  . Chronic migraine without aura 05/12/2016  . Attention deficit 01/09/2016  . Morbid (severe) obesity due to excess calories (HCC) 12/09/2015  . Anxiety 10/11/2015  .  Back ache 10/11/2015  . Clinical depression 10/11/2015  . Gait disturbance 10/11/2015  . Visual disturbance 10/11/2015  . Other fatigue 10/11/2015  . Chest pain 07/08/2015  . Combined fat and carbohydrate induced hyperlipemia 07/08/2015  . Common migraine with intractable migraine 07/07/2015  . Mild episode of recurrent major depressive disorder (HCC) 04/12/2015  . Relapsing remitting multiple sclerosis (HCC) 04/12/2015  . 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 Detrusor muscle hypertonia 03/14/2014  . CN (constipation) 01/09/2014  . FOM (frequency of micturition) 01/09/2014  . Delayed onset of urination 01/09/2014  . Family history of malignant neoplasm of prostate 01/04/2014  . Arthralgia of hip 05/06/2012  . Headache, migraine 07/31/2011  . Vitreous degeneration 07/24/2011  . Multiple sclerosis (HCC) 06/22/2011   Cammie Mcgee, PT, DPT # [id number] 03/14/2018, 5:06 PM  [place] [facility] [address] [place], [place], [address] Phone: [phone]   Fax:  [phone]  Name: Tayson Schnelle MRN: [id number] Date of Birth: 28-Oct-1965

## 2018-03-16 ENCOUNTER — Encounter: Payer: Self-pay | Admitting: Physical Therapy

## 2018-03-16 ENCOUNTER — Ambulatory Visit: Payer: Medicare HMO | Admitting: Physical Therapy

## 2018-03-16 DIAGNOSIS — M5441 Lumbago with sciatica, right side: Secondary | ICD-10-CM | POA: Diagnosis not present

## 2018-03-16 DIAGNOSIS — R2689 Other abnormalities of gait and mobility: Secondary | ICD-10-CM

## 2018-03-16 DIAGNOSIS — G8929 Other chronic pain: Secondary | ICD-10-CM

## 2018-03-16 DIAGNOSIS — M6281 Muscle weakness (generalized): Secondary | ICD-10-CM

## 2018-03-16 NOTE — Therapy (Signed)
[facility] [address] [place], [place], [address] Phone: [phone]   Fax:  [phone]  Physical Therapy Treatment  Patient Details  Name: Parker [name] MRN: [id number] Date of Birth: 03-19-1966 Referring Provider: Despina Arias MD   Encounter Date: 03/16/2018  PT End of Session - 03/16/18 1022    Visit Number  9    Number of Visits  17    Date for PT Re-Evaluation  04/18/18    Authorization - Visit Number  9    Authorization - Number of Visits  10    PT Start Time  1012    PT Stop Time  1119    PT Time Calculation (min)  67 min    Activity Tolerance  Patient tolerated treatment well;Patient limited by pain    Behavior During Therapy  [facility] for tasks assessed/performed       Past Medical History:  Diagnosis Date  . Depression   . Headache   . Hearing loss   . Memory loss   . Multiple sclerosis (HCC)   . Vision abnormalities     Past Surgical History:  Procedure Laterality Date  . COLONOSCOPY WITH PROPOFOL N/A 07/01/2015   Procedure: COLONOSCOPY WITH PROPOFOL;  Surgeon: Christena Deem, MD;  Location: [facility] ENDOSCOPY;  Service: Endoscopy;  Laterality: N/A;  . varicose seals      There were no vitals filed for this visit.  Subjective Assessment - 03/16/18 1018    Subjective  Pt. states that he will be leaving for [facility] tomorrow and will be splitting driving time with wife.  Back pain remains persistent and nothing seems to help.   Pt. did not sleep well again last night.      Pertinent History  Patient reports that he has had low back pain for years with recent insidious exacerbation for which he saw PCP before referral to PT. Patient feels his symptoms are due to MS. Reports that pain is constant and does not change with position or activity; worse at night. Pt denies radicular symptoms but reports intermittent pain in medial arch of R foot and medial thigh as well as intermittent pain in R hip. Denies b/b, cancer.  Use of OTC pain relievers (e.g. advil, tylenol) has not helped; extension-oriented exercise has also not resolved pain. Heat also does not help. Formerly worked in [employer] education officer, now on disability; moved to [place] two years ago for warmer weather. 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  Yes    Pain Score  7     Pain Location  Hip    Pain Orientation  Right    Pain Descriptors / Indicators  Aching    Pain Type  Chronic pain    Pain Onset  [name] than a month ago    Pain Frequency  Constant        Treatment:   Manual tx.:   Supine L/R LE/lumbar generalized stretches(15 min.) Supine L/R LAD 2x30 sec.    There Ex:  Sidelying L/R Clamshells 2x15 with green theraband Supine bridging 2x15 Static sitting on blue yoga ball 2x30sec Seated pelvic tilts (A/P, Lateral) seated on blue yoga ball Seated marches on blue yoga ball Alternating dead bug sitting on blue yoga ball with use of //bar for stability and with SBA for safety Thoracic rotation while seated on blue  yoga ball for core stability    Scifit L6.510 min. B UE/LE (warm-up/no charge).     PT Long Term Goals - 03/16/18 1305      PT LONG TERM GOAL #1   Title  Patient will demonstrate independence with HEP to improve gross RLE hip strength to 4+/5.    Baseline  RLE hip strength 4.    Time  4    Period  Weeks    Status  On-going    Target Date  03/16/18      PT LONG TERM GOAL #2   Title  Patient will improve FOTO score to 67 to demonstrate reduced disability and pain.    Baseline  FOTO 59    Time  4    Period  Weeks    Status  On-going    Target Date  03/16/18      PT LONG TERM GOAL #3   Title  Patient will be able to sleep through the night without waking due to pain.    Baseline  Sleep interrupted due to pain    Time  4    Period  Weeks    Status  On-going    Target Date  03/16/18       PT LONG TERM GOAL #4   Title  Patient will report best pain of 3/10 NPS to facilitate improved sleeping and reduced pain with ADLs.    Baseline  7/10 reported today    Time  4    Period  Weeks    Status  On-going    Target Date  03/16/18       Plan - 03/16/18 1620    Clinical Impression Statement  [name] in hip musculature during manual therapy.  [name] with movements today in hip, but having [name] difficulty with pain in lower back.  Pt. progressed to core strengthening on blue yoga ball and tolerated tx. well.  [name] to [place] this upcoming week and will not be returning to therapy until Monday, 6/24 in which focus will be shifted to core strengthening in order to decrease back pain.    Clinical Presentation  Evolving    Clinical Decision Making  Moderate    Rehab Potential  Fair    Clinical Impairments Affecting Rehab Potential  + Social support, condition stable / - Chronicity of condition; comorbidities;     PT Frequency  2x / week    PT Duration  8 weeks    PT Treatment/Interventions  Biofeedback;Electrical Stimulation;Moist Heat;Traction;Ultrasound;Gait training;Therapeutic activities;Therapeutic exercise;Neuromuscular re-education;Balance training;Patient/family education;Manual techniques;Passive range of motion;Energy conservation;Taping;Aquatic Therapy;DME Instruction    PT Next Visit Plan  Core strengthening/ progress HEP.    PT Home Exercise Plan  Figure 4 while riding in car to [place].    Consulted and Agree with Plan of Care  Patient       Patient will benefit from skilled therapeutic intervention in order to improve the following deficits and impairments:  Decreased endurance, Hypomobility, Pain, Decreased activity tolerance, Abnormal gait, Decreased strength, Impaired perceived functional ability, Impaired flexibility  Visit Diagnosis: Chronic right-sided low back pain with right-sided  sciatica  Other abnormalities of gait and mobility  Muscle weakness (generalized)     Problem List Patient Active Problem List   Diagnosis Date Noted  . Chronic lower back pain 01/05/2018  . Carpal tunnel syndrome on left 08/17/2017  . Numbness 07/07/2017  . Chronic migraine without aura 05/12/2016  .  Attention deficit 01/09/2016  . Morbid (severe) obesity due to excess calories (HCC) 12/09/2015  . Anxiety 10/11/2015  . Back ache 10/11/2015  . Clinical depression 10/11/2015  . Gait disturbance 10/11/2015  . Visual disturbance 10/11/2015  . Other fatigue 10/11/2015  . Chest pain 07/08/2015  . Combined fat and carbohydrate induced hyperlipemia 07/08/2015  . Common migraine with intractable migraine 07/07/2015  . Mild episode of recurrent major depressive disorder (HCC) 04/12/2015  . Relapsing remitting multiple sclerosis (HCC) 04/12/2015  . 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 Detrusor muscle hypertonia 03/14/2014  . CN (constipation) 01/09/2014  . FOM (frequency of micturition) 01/09/2014  . Delayed onset of urination 01/09/2014  . Family history of malignant neoplasm of prostate 01/04/2014  . Arthralgia of hip 05/06/2012  . Headache, migraine 07/31/2011  . Vitreous degeneration 07/24/2011  . Multiple sclerosis (HCC) 06/22/2011   Cammie Mcgee, PT, DPT # [phone] Tomasa Hose, SPT 03/16/2018, 5:11 PM  [facility] [address] [place], [place], [address] Phone: [phone]   Fax:  [phone]  Name: Parker [name] MRN: [id number] Date of Birth: 09-22-1966

## 2018-03-27 ENCOUNTER — Other Ambulatory Visit: Payer: Self-pay | Admitting: Neurology

## 2018-03-28 ENCOUNTER — Ambulatory Visit: Payer: Medicare HMO | Admitting: Physical Therapy

## 2018-03-28 ENCOUNTER — Emergency Department: Payer: Medicare HMO

## 2018-03-28 ENCOUNTER — Emergency Department
Admission: EM | Admit: 2018-03-28 | Discharge: 2018-03-28 | Disposition: A | Discharge status: Home / Self Care | Payer: Medicare HMO | Attending: Emergency Medicine | Admitting: Emergency Medicine

## 2018-03-28 ENCOUNTER — Encounter: Payer: Self-pay | Admitting: Physical Therapy

## 2018-03-28 DIAGNOSIS — Y9389 Activity, other specified: Secondary | ICD-10-CM | POA: Insufficient documentation

## 2018-03-28 DIAGNOSIS — Z79899 Other long term (current) drug therapy: Secondary | ICD-10-CM | POA: Diagnosis not present

## 2018-03-28 DIAGNOSIS — F329 Major depressive disorder, single episode, unspecified: Secondary | ICD-10-CM | POA: Insufficient documentation

## 2018-03-28 DIAGNOSIS — F419 Anxiety disorder, unspecified: Secondary | ICD-10-CM | POA: Insufficient documentation

## 2018-03-28 DIAGNOSIS — S61412A Laceration without foreign body of left hand, initial encounter: Secondary | ICD-10-CM

## 2018-03-28 DIAGNOSIS — G35 Multiple sclerosis: Secondary | ICD-10-CM | POA: Diagnosis not present

## 2018-03-28 DIAGNOSIS — W25XXXA Contact with sharp glass, initial encounter: Secondary | ICD-10-CM | POA: Diagnosis not present

## 2018-03-28 DIAGNOSIS — Y929 Unspecified place or not applicable: Secondary | ICD-10-CM | POA: Diagnosis not present

## 2018-03-28 DIAGNOSIS — Y998 Other external cause status: Secondary | ICD-10-CM | POA: Insufficient documentation

## 2018-03-28 DIAGNOSIS — M5441 Lumbago with sciatica, right side: Principal | ICD-10-CM

## 2018-03-28 DIAGNOSIS — S60222A Contusion of left hand, initial encounter: Secondary | ICD-10-CM | POA: Diagnosis not present

## 2018-03-28 DIAGNOSIS — G8929 Other chronic pain: Secondary | ICD-10-CM

## 2018-03-28 DIAGNOSIS — Z23 Encounter for immunization: Secondary | ICD-10-CM | POA: Diagnosis not present

## 2018-03-28 DIAGNOSIS — M6281 Muscle weakness (generalized): Secondary | ICD-10-CM

## 2018-03-28 DIAGNOSIS — R2689 Other abnormalities of gait and mobility: Secondary | ICD-10-CM

## 2018-03-28 MED ORDER — TETANUS-DIPHTH-ACELL PERTUSSIS 5-2.5-18.5 LF-MCG/0.5 IM SUSP
0.5000 mL | Freq: Once | INTRAMUSCULAR | Status: AC
  Administered 2018-03-28: 0.5 mL via INTRAMUSCULAR
  Filled 2018-03-28: qty 0.5

## 2018-03-28 MED ORDER — HYDROCODONE-ACETAMINOPHEN 5-325 MG PO TABS
1.0000 | ORAL_TABLET | Freq: Once | ORAL | Status: AC
  Administered 2018-03-28: 1 via ORAL
  Filled 2018-03-28: qty 1

## 2018-03-28 MED ORDER — HYDROCODONE-ACETAMINOPHEN 5-325 MG PO TABS: 1.0000 | ORAL_TABLET | Freq: Four times a day (QID) | ORAL | 0 refills | Status: AC | PRN

## 2018-03-28 NOTE — ED Triage Notes (Signed)
Pt arrives today POV for hand laceration. Pt states glass fell on Left hand on the top area. Pt has laceration wrapped at this time. Pt is NAD and VS are WNL.

## 2018-03-28 NOTE — ED Notes (Signed)
Pt labs collected and sent at this time. Wound is redressed clean and dry. Laceration is approx. 1 inch across top of hand bleeding controlled.

## 2018-03-28 NOTE — ED Provider Notes (Signed)
[facility] Emergency Department Provider Note  ____________________________________________   First MD Initiated Contact with Patient 03/28/18 1646     (approximate)  I have reviewed the triage vital signs and the nursing notes.   HISTORY  Chief Complaint Laceration   HPI Parker [name] is a 52 y.o. male is here for laceration to his left hand.  Patient was moving a panel of glass from a cabinet door when it fell onto his hand causing laceration.  Patient is uncertain when his last tetanus was given.  He believes that it is been over 10 years.  He rates his pain as 6/10.  Patient states that he does have MS.    Past Medical History:  Diagnosis Date  . Depression   . Headache   . Hearing loss   . Memory loss   . Multiple sclerosis (HCC)   . Vision abnormalities     Patient Active Problem List   Diagnosis Date Noted  . Chronic lower back pain 01/05/2018  . Carpal tunnel syndrome on left 08/17/2017  . Numbness 07/07/2017  . Chronic migraine without aura 05/12/2016  . Attention deficit 01/09/2016  . Morbid (severe) obesity due to excess calories (HCC) 12/09/2015  . Anxiety 10/11/2015  . Back ache 10/11/2015  . Clinical depression 10/11/2015  . Gait disturbance 10/11/2015  . Visual disturbance 10/11/2015  . Other fatigue 10/11/2015  . Chest pain 07/08/2015  . Combined fat and carbohydrate induced hyperlipemia 07/08/2015  . Common migraine with intractable migraine 07/07/2015  . Mild episode of recurrent major depressive disorder (HCC) 04/12/2015  . Relapsing remitting multiple sclerosis (HCC) 04/12/2015  . 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 Detrusor muscle hypertonia 03/14/2014  . CN (constipation) 01/09/2014  . FOM (frequency of micturition) 01/09/2014  . Delayed onset of urination 01/09/2014  . Family history of malignant neoplasm of prostate 01/04/2014  . Arthralgia of hip 05/06/2012  . Headache, migraine 07/31/2011  . Vitreous  degeneration 07/24/2011  . Multiple sclerosis (HCC) 06/22/2011    Past Surgical History:  Procedure Laterality Date  . COLONOSCOPY WITH PROPOFOL N/A 07/01/2015   Procedure: COLONOSCOPY WITH PROPOFOL;  Surgeon: Christena Deem, MD;  Location: [facility] ENDOSCOPY;  Service: Endoscopy;  Laterality: N/A;  . varicose seals      Prior to Admission medications   Medication Sig Start Date End Date Taking? Authorizing Provider  amantadine (SYMMETREL) 100 MG capsule TAKE ONE CAPSULE BY MOUTH TWICE A DAY 03/28/18   Sater, Pearletha Furl, MD  baclofen (LIORESAL) 20 MG tablet TAKE 1 TABLET BY MOUTH THREE TIMES DAILY 09/29/17   Sater, Pearletha Furl, MD  DULoxetine (CYMBALTA) 60 MG capsule TAKE 1 CAPSULE(60 MG) BY MOUTH DAILY 03/02/17   Sater, Pearletha Furl, MD  gabapentin (NEURONTIN) 600 MG tablet Take 1 tablet (600 mg total) by mouth 3 (three) times daily. 12/14/17   Sater, Pearletha Furl, MD  HYDROcodone-acetaminophen (NORCO/VICODIN) 5-325 MG tablet Take 1 tablet by mouth every 6 (six) hours as needed for moderate pain. 03/28/18   Tommi Rumps, PA-C  levETIRAcetam (KEPPRA) 500 MG tablet One po tid 09/11/16   Asa Lente, MD  modafinil (PROVIGIL) 200 MG tablet TAKE 1 TABLET BY MOUTH ONCE DAILY 01/31/18   Sater, Pearletha Furl, MD  ocrelizumab 300 mg in sodium chloride 0.9 % 250 mL Inject 300 mg into the vein once.    [provider]  ocrelizumab 600 mg in sodium chloride 0.9 % 500 mL Inject 600 mg into the vein every 6 (  six) months.    [provider]  tamsulosin (FLOMAX) 0.4 MG CAPS capsule TAKE 1 CAPSULE BY MOUTH EVERY DAY 01/24/18   Sater, Pearletha Furl, MD    Allergies Glucosamine forte [nutritional supplements]; Sulfa antibiotics; Sulfur; Tapentadol; Clonazepam; Imipramine; and Penicillins  Family History  Problem Relation Age of Onset  . Lung cancer Mother   . Heart disease Father   . Prostate cancer Father   . Diabetes type II Father     Social History Social History   Tobacco Use  . Smoking  status: Never Smoker  . Smokeless tobacco: Never Used  Substance Use Topics  . Alcohol use: No  . Drug use: No    Review of Systems Constitutional: No fever/chills Cardiovascular: Denies chest pain. Respiratory: Denies shortness of breath. Musculoskeletal: Positive for left hand pain. Skin:  Positive for laceration. Neurological: Negative for headaches, focal weakness or numbness. ___________________________________________   PHYSICAL EXAM:  VITAL SIGNS: ED Triage Vitals  Enc Vitals Group     BP 03/28/18 1609 112/71     Pulse --      Resp 03/28/18 1609 16     Temp 03/28/18 1609 99 F (37.2 C)     Temp Source 03/28/18 1609 Oral     SpO2 03/28/18 1609 95 %     Weight 03/28/18 1610 260 lb (117.9 kg)     Height 03/28/18 1610 5\' 11"  (1.803 m)     Head Circumference --      Peak Flow --      Pain Score 03/28/18 1609 6     Pain Loc --      Pain Edu? --      Excl. in GC? --    Constitutional: Alert and oriented. Well appearing and in no acute distress. Eyes: Conjunctivae are normal.  Head: Atraumatic. Neck: No stridor.   Cardiovascular: Normal rate, regular rhythm. Grossly normal heart sounds.  Good peripheral circulation. Respiratory: Normal respiratory effort.  No retractions. Lungs CTAB. Musculoskeletal: There is a superficial laceration without active bleeding on the dorsal aspect of the left hand.  No foreign body is seen.  Patient is able to move digits distally with some discomfort to his hand.  Capillary refill is less than 3 seconds.  Marked tenderness on palpation dorsal hand. Neurologic:  Normal speech and language. No gross focal neurologic deficits are appreciated. No gait instability. Skin:  Skin is warm, dry.  Abrasion as noted above. Psychiatric: Mood and affect are normal. Speech and behavior are normal.  ____________________________________________   LABS (all labs ordered are listed, but only abnormal results are displayed)  Labs Reviewed - No data to  display  RADIOLOGY  ED MD interpretation:   Left hand no fracture or or foreign bodies.  Official radiology report(s): Dg Hand Complete Left  Result Date: 03/28/2018 CLINICAL DATA:  Left hand laceration. EXAM: LEFT HAND - COMPLETE 3+ VIEW COMPARISON:  None. FINDINGS: There is no evidence of fracture or dislocation. There is no evidence of arthropathy or other focal bone abnormality. Soft tissues are unremarkable. IMPRESSION: Normal left hand.  No radiopaque foreign body is noted. Electronically Signed   By: Lupita Raider, M.D.   On: 03/28/2018 17:24    ____________________________________________   PROCEDURES  Procedure(s) performed: Area was cleaned with normal saline.  Bleeding was still under control.  Dermabond was applied by Fatima Blank, PAS.  A Jones dressing was then applied to provide cushioning and protection for the dorsal aspect of his hand.  Patient  was given instructions on how to care for the laceration.    Procedures  Critical Care performed: No  ____________________________________________   INITIAL IMPRESSION / ASSESSMENT AND PLAN / ED COURSE  As part of my medical decision making, I reviewed the following data within the electronic MEDICAL RECORD NUMBER Notes from prior ED visits and [place] Controlled Substance Database  Patient presents with a blunt trauma and laceration to his left hand.  No foreign body or fractures were noted on x-ray.  Area was cleaned with normal saline and Dermabond was applied.  Patient was discharged with prescription for Norco as needed for pain.  He is to follow-up with his PCP if any continued problems.  ____________________________________________   FINAL CLINICAL IMPRESSION(S) / ED DIAGNOSES  Final diagnoses:  Laceration of left hand without foreign body, initial encounter  Contusion of left hand, initial encounter     ED Discharge Orders        Ordered    HYDROcodone-acetaminophen (NORCO/VICODIN) 5-325 MG tablet  Every 6 hours  PRN     03/28/18 1806       Note:  This document was prepared using Dragon voice recognition software and may include unintentional dictation errors.    Tommi Rumps, PA-C 03/28/18 1831    Jeanmarie Plant, MD 03/28/18 2010

## 2018-03-28 NOTE — Therapy (Signed)
[facility] [address] [place], [place], [address] Phone: [phone]   Fax:  [phone]  Physical Therapy Treatment  Patient Details  Name: Parker [name] MRN: [id number] Date of Birth: September 30, 1966 Referring Provider: Despina Arias MD   Encounter Date: 03/28/2018  PT End of Session - 03/28/18 1402    Visit Number  10    Number of Visits  17    Date for PT Re-Evaluation  04/18/18    PT Start Time  0902    PT Stop Time  0948    PT Time Calculation (min)  46 min    Equipment Utilized During Treatment  Gait belt    Activity Tolerance  Patient tolerated treatment well;Patient limited by pain    Behavior During Therapy  [facility] for tasks assessed/performed       Past Medical History:  Diagnosis Date  . Depression   . Headache   . Hearing loss   . Memory loss   . Multiple sclerosis (HCC)   . Vision abnormalities     Past Surgical History:  Procedure Laterality Date  . COLONOSCOPY WITH PROPOFOL N/A 07/01/2015   Procedure: COLONOSCOPY WITH PROPOFOL;  Surgeon: Christena Deem, MD;  Location: [facility] ENDOSCOPY;  Service: Endoscopy;  Laterality: N/A;  . varicose seals      There were no vitals filed for this visit.  Subjective Assessment - 03/28/18 1355    Subjective  Pt. reports [place] trip went okay and he reported to have worked on stretching in the car while wife was driving.  No new complaints.  Pt. reports hand/ wrist have been doing better since injection.      Pertinent History  Patient reports that he has had low back pain for years with recent insidious exacerbation for which he saw PCP before referral to PT. Patient feels his symptoms are due to MS. Reports that pain is constant and does not change with position or activity; worse at night. Pt denies radicular symptoms but reports intermittent pain in medial arch of R foot and medial thigh as well as intermittent pain in R hip. Denies b/b, cancer. Use of OTC pain relievers  (e.g. advil, tylenol) has not helped; extension-oriented exercise has also not resolved pain. Heat also does not help. Formerly worked in [employer] education officer, now on disability; moved to [place] two years ago for warmer weather. 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  Yes    Pain Score  6     Pain Location  Back    Pain Orientation  Lower;Right    Pain Descriptors / Indicators  Aching    Pain Type  Chronic pain    Pain Onset  More than a month ago    Pain Frequency  Constant        Treatment:     Manual tx.:    Supine L/R LE/lumbar generalized stretches (15 min.) Supine L/R LAD 2x30 sec.      There Ex:   [employer] 2 laps around gym with GTB SideStepping 12 ft x 4 with GTB Supine bridging 2x15 Static sitting on blue yoga ball 2x30sec Seated pelvic tilts (A/P, Lateral) seated on blue yoga ball Seated marches on blue yoga  ball Alternating dead bug sitting on blue yoga ball with use of //bar for stability and with SBA for safety Thoracic rotation while seated on blue yoga ball for core stability      Scifit L6.5 10 min. B UE/LE (warm-up/no charge).     PT Education - 03/28/18 1402    Education provided  Yes    Education Details  Pt. educated on importance of walking and coninuing exercise program at home.    Person(s) Educated  Patient    Methods  Explanation    Comprehension  Verbalized understanding          PT Long Term Goals - 03/28/18 1411      PT LONG TERM GOAL #1   Title  Patient will demonstrate independence with HEP to improve gross RLE hip strength to 4+/5.    Baseline  RLE hip strength 4.    Time  4    Period  Weeks    Status  On-going    Target Date  03/16/18      PT LONG TERM GOAL #2   Title  Patient will  improve FOTO score to 67 to demonstrate reduced disability and pain.    Baseline  FOTO 59    Time  4    Period  Weeks    Status  On-going    Target Date  03/16/18      PT LONG TERM GOAL #3   Title  Patient will be able to sleep through the night without waking due to pain.    Baseline  Sleep interrupted due to pain    Time  4    Period  Weeks    Status  On-going    Target Date  03/16/18      PT LONG TERM GOAL #4   Title  Patient will report best pain of 3/10 NPS to facilitate improved sleeping and reduced pain with ADLs.    Baseline  7/10 reported today    Time  4    Period  Weeks    Status  On-going    Target Date  03/16/18            Plan - 03/28/18 1403    Clinical Impression Statement  Pt. demonstrated greater hamstring length in B LEs today.  Pt. reported decreased pain in legs/hips, but discomfort with double knee to chest.  Pt. reports R LBP more than hip recently.  Pt. performed well on blue yoga ball, and is demonstrating better pelvic movement and awareness of pelvis in space.    Clinical Presentation  Evolving    Clinical Decision Making  Moderate    Rehab Potential  Fair    Clinical Impairments Affecting Rehab Potential  + Social support, condition stable / - Chronicity of condition; comorbidities;     PT Frequency  2x / week    PT Duration  8 weeks    PT Treatment/Interventions  Biofeedback;Electrical Stimulation;Moist Heat;Traction;Ultrasound;Gait training;Therapeutic activities;Therapeutic exercise;Neuromuscular re-education;Balance training;Patient/family education;Manual techniques;Passive range of motion;Energy conservation;Taping;Aquatic Therapy;DME Instruction    PT Next Visit Plan  Assess function/ give progressive HEP that can be used for prolonged time period.  1 more tx. session.     PT Home Exercise Plan  Stretching/ Attempt going to gym.    Consulted and Agree with Plan of Care  Patient       Patient will benefit from skilled therapeutic  intervention in order to improve the following deficits and impairments:  Decreased endurance, Hypomobility, Pain, Decreased  activity tolerance, Abnormal gait, Decreased strength, Impaired perceived functional ability, Impaired flexibility  Visit Diagnosis: Chronic right-sided low back pain with right-sided sciatica  Other abnormalities of gait and mobility  Muscle weakness (generalized)     Problem List Patient Active Problem List   Diagnosis Date Noted  . Chronic lower back pain 01/05/2018  . Carpal tunnel syndrome on left 08/17/2017  . Numbness 07/07/2017  . Chronic migraine without aura 05/12/2016  . Attention deficit 01/09/2016  . Morbid (severe) obesity due to excess calories (HCC) 12/09/2015  . Anxiety 10/11/2015  . Back ache 10/11/2015  . Clinical depression 10/11/2015  . Gait disturbance 10/11/2015  . Visual disturbance 10/11/2015  . Other fatigue 10/11/2015  . Chest pain 07/08/2015  . Combined fat and carbohydrate induced hyperlipemia 07/08/2015  . Common migraine with intractable migraine 07/07/2015  . Mild episode of recurrent major depressive disorder (HCC) 04/12/2015  . Relapsing remitting multiple sclerosis (HCC) 04/12/2015  . 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 Detrusor muscle hypertonia 03/14/2014  . CN (constipation) 01/09/2014  . FOM (frequency of micturition) 01/09/2014  . Delayed onset of urination 01/09/2014  . Family history of malignant neoplasm of prostate 01/04/2014  . Arthralgia of hip 05/06/2012  . Headache, migraine 07/31/2011  . Vitreous degeneration 07/24/2011  . Multiple sclerosis (HCC) 06/22/2011   Cammie Mcgee, PT, DPT # [id number] Tomasa Hose, SPT 03/28/2018, 5:13 PM  [facility] [address] [place], [place], [address] Phone: [phone]   Fax:  [phone]  Name: Parker [name] MRN: [id number] Date of Birth: Feb 10, 1966

## 2018-03-28 NOTE — Discharge Instructions (Signed)
Watch area for any signs of infection.  Keep area clean and dry.  Norco if needed for pain.  Also elevate your hand to prevent swelling.  You may use Ace wrap for padding and protection of your hand only.  Allow the Dermabond to peel off on its own.  Follow-up with Dr. Harrington Challenger if any continued problems with your hand.

## 2018-03-28 NOTE — ED Notes (Signed)
Pt has a superficial lac to the left hand with controlled bleeding. Pt states he some how slipped and dropped a sheet of glass that sliced across his left hand. Clean bandage in place.the patient has full function of the left hand.

## 2018-04-01 ENCOUNTER — Other Ambulatory Visit: Payer: Self-pay | Admitting: Neurology

## 2018-04-11 ENCOUNTER — Encounter: Payer: Self-pay | Admitting: Physical Therapy

## 2018-04-11 ENCOUNTER — Ambulatory Visit: Payer: Medicare HMO | Attending: Neurology | Admitting: Physical Therapy

## 2018-04-11 DIAGNOSIS — M5441 Lumbago with sciatica, right side: Secondary | ICD-10-CM | POA: Insufficient documentation

## 2018-04-11 DIAGNOSIS — G8929 Other chronic pain: Secondary | ICD-10-CM

## 2018-04-11 DIAGNOSIS — M6281 Muscle weakness (generalized): Secondary | ICD-10-CM | POA: Diagnosis present

## 2018-04-11 DIAGNOSIS — R2689 Other abnormalities of gait and mobility: Secondary | ICD-10-CM

## 2018-04-11 NOTE — Patient Instructions (Addendum)
Access Code: [email or web address]  URL: [email or web address]  Date: 04/11/2018  Prepared by: Tomasa Hose   Exercises  Clamshell - 10 reps - 1 sets - 1x daily - 4x weekly  Supine Bridge - 10 reps - 1 sets - 1x daily - 4x weekly  Supine Active Straight Leg Raise - 10 reps - 1 sets - 1x daily - 4x weekly  Bird Dog - 5 reps - 2 sets - 1x daily - 4x weekly  Supine Lower Trunk Rotation - 10 reps - 1 sets - 1x daily - 4x weekly  Prone Press Up - 10 reps - 1 sets - 1x daily - 4x weekly  Dead Bug - 5 reps - 2 sets - 1x daily - 7x weekly  Isometric Dead Bug - 5 reps - 2 sets - 1x daily - 4x weekly  Supine Single Knee to Chest - 10 reps - 1 sets - 1x daily - 4x weekly  Supine Double Knee to Chest Modified - 10 reps - 3 sets - 1x daily - 4x weekly  Seated Hamstring Stretch - 10 reps - 1 sets - 1x daily - 7x weekly  Supine Hamstring Stretch with Strap - 10 reps - 1 sets - 1x daily - 7x weekly

## 2018-04-11 NOTE — Therapy (Signed)
[facility] [place] [address] [place], [place], [address] Phone: [phone]   Fax:  [phone]  Physical Therapy Treatment  Patient Details  Name: Parker [name] MRN: [id number] Date of Birth: 20-Apr-1966 Referring Provider: Arlice Colt MD   Encounter Date: 04/11/2018  PT End of Session - 04/11/18 0848    Visit Number  11    Number of Visits  17    Date for PT Re-Evaluation  04/18/18    PT Start Time  0758    PT Stop Time  0854    PT Time Calculation (min)  56 min    Equipment Utilized During Treatment  Gait belt    Activity Tolerance  Patient tolerated treatment well;Patient limited by pain    Behavior During Therapy  [facility] for tasks assessed/performed       Past Medical History:  Diagnosis Date  . Depression   . Headache   . Hearing loss   . Memory loss   . Multiple sclerosis ([place])   . Vision abnormalities     Past Surgical History:  Procedure Laterality Date  . COLONOSCOPY WITH PROPOFOL N/A 07/01/2015   Procedure: COLONOSCOPY WITH PROPOFOL;  Surgeon: Lollie Sails, MD;  Location: [facility] ENDOSCOPY;  Service: Endoscopy;  Laterality: N/A;  . varicose seals      There were no vitals filed for this visit.  Subjective Assessment - 04/11/18 0845    Subjective  Pt. reports to have had a rough couple of weeks due to ED visit for laceration of R dorsal hand.  Pt. reports to have been doing HEP with no new complaints.  Pt. reports 3/10 pain today which is the best it has been in recent memory.    Pertinent History  Patient reports that he has had low back pain for years with recent insidious exacerbation for which he saw PCP before referral to PT. Patient feels his symptoms are due to MS. Reports that pain is constant and does not change with position or activity; worse at night. Pt denies radicular symptoms but reports intermittent pain in medial arch of R foot and medial thigh as well as intermittent pain in R hip. Denies b/b,  cancer. Use of OTC pain relievers (e.g. advil, tylenol) has not helped; extension-oriented exercise has also not resolved pain. Heat also does not help. Formerly worked in [employer] under[employer], now on disability; moved to [place] two years ago for warmer weather. 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  Yes    Pain Score  3     Pain Location  Back    Pain Orientation  Left;Right    Pain Descriptors / Indicators  Aching    Pain Type  Acute pain    Pain Onset  More than a month ago    Pain Frequency  Constant        Treatment:   Manual tx.:   SupineL/RLE/lumbar generalized stretches(37mn.)    There Ex:  Walking 6 laps around gym with verbal cueing for upright posture Resisted walking in // bars with use of double BlackTB (Frontwards, backwards, lateral) x10 each Supine alternating dead bug with verbal cues for core stabilization and breathing during exercise Instruction for HEP with verbal  cues and returned demonstration: Supine Bridge Supine Active Straight Leg Raise Supine Lower Trunk Rotation Isometric Dead Bug Supine Single Knee to Chest Supine Double Knee to Chest Modified Supine Hamstring Stretch with Strap      PT Long Term Goals - 04/11/18 0813      PT LONG TERM GOAL #1   Title  Patient will demonstrate independence with HEP to improve gross RLE hip strength to 4+/5.    Baseline  RLE hip strength 4.    Time  4    Period  Weeks    Status  Partially Met    Target Date  03/16/18      PT LONG TERM GOAL #2   Title  Patient will improve FOTO score to 67 to demonstrate reduced disability and pain.    Baseline  FOTO: 60    Time  4    Period  Weeks    Status  Partially Met    Target Date  03/16/18      PT LONG TERM  GOAL #3   Title  Patient will be able to sleep through the night without waking due to pain.    Baseline  Sleep interrupted due to pain    Time  4    Period  Weeks    Status  Partially Met    Target Date  03/16/18      PT LONG TERM GOAL #4   Title  Patient will report best pain of 3/10 NPS to facilitate improved sleeping and reduced pain with ADLs.    Baseline  3/10 reported today    Time  4    Period  Weeks    Status  Achieved    Target Date  03/16/18            Plan - 04/11/18 0849    Clinical Impression Statement  Pt. has made significant improvements in reduction of pain and increase in gait efficiency.  Pt. educated on discharge option and was receptive to decision based on improvements made.  Pt. self reports that pain is better than its been in recent memory and he is pleased with progression made.  Pt. is demonstrating independence with HEP and has improved FOTO score to a 60.  Pt. does have continued R LE weakness but is aware of deficit and will continue with HEP to increase strength.  Patient at this time is functional and safe at home and in community, independent with HEP, and has progressed to independent home program.  Pt. instructed on HEP and to call in a couple weeks to check-in for update on progression.      Clinical Presentation  Evolving    Clinical Decision Making  Moderate    Rehab Potential  Fair    Clinical Impairments Affecting Rehab Potential  + Social support, condition stable / - Chronicity of condition; comorbidities;     PT Frequency  2x / week    PT Duration  8 weeks    PT Treatment/Interventions  Biofeedback;Electrical Stimulation;Moist Heat;Traction;Ultrasound;Gait training;Therapeutic activities;Therapeutic exercise;Neuromuscular re-education;Balance training;Patient/family education;Manual techniques;Passive range of motion;Energy conservation;Taping;Aquatic Therapy;DME Instruction    PT Next Visit Plan  D/C at this time.    PT Home Exercise Plan   see HEP above.    Consulted and Agree with Plan of Care  Patient       Patient will benefit from skilled therapeutic intervention in order to improve the following deficits and impairments:  Decreased endurance, Hypomobility, Pain, Decreased activity tolerance, Abnormal gait,  Decreased strength, Impaired perceived functional ability, Impaired flexibility  Visit Diagnosis: Chronic right-sided low back pain with right-sided sciatica  Other abnormalities of gait and mobility  Muscle weakness (generalized)     Problem List Patient Active Problem List   Diagnosis Date Noted  . Chronic lower back pain 01/05/2018  . Carpal tunnel syndrome on left 08/17/2017  . Numbness 07/07/2017  . Chronic migraine without aura 05/12/2016  . Attention deficit 01/09/2016  . Morbid (severe) obesity due to excess calories ([place]) 12/09/2015  . Anxiety 10/11/2015  . Back ache 10/11/2015  . Clinical depression 10/11/2015  . Gait disturbance 10/11/2015  . Visual disturbance 10/11/2015  . Other fatigue 10/11/2015  . Chest pain 07/08/2015  . Combined fat and carbohydrate induced hyperlipemia 07/08/2015  . Common migraine with intractable migraine 07/07/2015  . Mild episode of recurrent major depressive disorder ([place]) 04/12/2015  . Relapsing remitting multiple sclerosis ([place]) 04/12/2015  . 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 Detrusor muscle hypertonia 03/14/2014  . CN (constipation) 01/09/2014  . FOM (frequency of micturition) 01/09/2014  . Delayed onset of urination 01/09/2014  . Family history of malignant neoplasm of prostate 01/04/2014  . Arthralgia of hip 05/06/2012  . Headache, migraine 07/31/2011  . Vitreous degeneration 07/24/2011  . Multiple sclerosis ([place]) 06/22/2011   Pura Spice, PT, DPT # [id number] [place], SPT 04/11/2018, 1:04 PM  [facility] [address] [place], [place], [address] Phone: [phone]   Fax:   [phone]  Name: Parker [name] MRN: [id number] Date of Birth: 27-Dec-1965

## 2018-06-09 ENCOUNTER — Telehealth: Payer: Self-pay | Admitting: Neurology

## 2018-06-09 MED ORDER — GABAPENTIN 600 MG PO TABS: 600.0000 mg | ORAL_TABLET | Freq: Three times a day (TID) | ORAL | 1 refills | Status: DC

## 2018-06-09 NOTE — Telephone Encounter (Signed)
Pt requesting a refill for gabapentin (NEURONTIN) 600 MG tablet sent to [facility].

## 2018-06-09 NOTE — Telephone Encounter (Signed)
Gabapentin escribed to [facility]

## 2018-07-05 ENCOUNTER — Encounter: Payer: Self-pay | Admitting: Neurology

## 2018-07-05 ENCOUNTER — Other Ambulatory Visit: Payer: Self-pay

## 2018-07-05 ENCOUNTER — Telehealth: Payer: Self-pay | Admitting: Neurology

## 2018-07-05 ENCOUNTER — Ambulatory Visit: Payer: Medicare HMO | Admitting: Neurology

## 2018-07-05 VITALS — BP 123/74 | HR 78 | Resp 18 | Ht 71.0 in | Wt 263.5 lb

## 2018-07-05 DIAGNOSIS — G5602 Carpal tunnel syndrome, left upper limb: Secondary | ICD-10-CM

## 2018-07-05 DIAGNOSIS — G35 Multiple sclerosis: Secondary | ICD-10-CM | POA: Diagnosis not present

## 2018-07-05 DIAGNOSIS — R269 Unspecified abnormalities of gait and mobility: Secondary | ICD-10-CM

## 2018-07-05 DIAGNOSIS — F329 Major depressive disorder, single episode, unspecified: Secondary | ICD-10-CM

## 2018-07-05 DIAGNOSIS — G43709 Chronic migraine without aura, not intractable, without status migrainosus: Secondary | ICD-10-CM | POA: Diagnosis not present

## 2018-07-05 DIAGNOSIS — G4719 Other hypersomnia: Secondary | ICD-10-CM

## 2018-07-05 DIAGNOSIS — R5383 Other fatigue: Secondary | ICD-10-CM

## 2018-07-05 DIAGNOSIS — N3644 Muscular disorders of urethra: Secondary | ICD-10-CM

## 2018-07-05 DIAGNOSIS — G4733 Obstructive sleep apnea (adult) (pediatric): Secondary | ICD-10-CM | POA: Diagnosis not present

## 2018-07-05 DIAGNOSIS — F32A Depression, unspecified: Secondary | ICD-10-CM

## 2018-07-05 MED ORDER — GALCANEZUMAB-GNLM 120 MG/ML ~~LOC~~ SOAJ: 1.0000 "pen " | SUBCUTANEOUS | 4 refills | Status: DC

## 2018-07-05 MED ORDER — MECLIZINE HCL 25 MG PO TABS: 25.0000 mg | ORAL_TABLET | Freq: Two times a day (BID) | ORAL | 5 refills | Status: DC | PRN

## 2018-07-05 NOTE — Telephone Encounter (Signed)
[employer] medicare order sent to [facility], they obtain the auth and will reach out to the pt to schedule.

## 2018-07-05 NOTE — Progress Notes (Signed)
[facility]  PATIENT: Parker [name] DOB: 03/08/1966  REFERRING DOCTOR OR PCP:  Dr. Cristopher Peru (fax  [phone]) SOURCE: patient, notes, MRI reports and MRI images on PACS  _________________________________   HISTORICAL  CHIEF COMPLAINT:  Chief Complaint  Patient presents with  . Multiple Sclerosis    Next Ocrevus scheduled for 08/02/18. Sts. Amantadine doesn't help much. Declined Botox due to high copay/fim    HISTORY OF PRESENT ILLNESS:  Parker [name] is a 52 y.o. man with multiple sclerosis and chronic migraine   Update 07/05/2018: He feels mostly stable.    He is on ocrelizumab.   He tolerates it well.   He has no definite exacerbation but notes some worsening symptoms  He is having more trouble with fatigue.    He notes this is physical > cognitive.    He is sleeping 4-6 hours sleep most nights.   He has more trouble falling asleep than falling asleep.    He sleeps worse when his back is hurting more.    He gets up earlier than he used to.   He feels mildly depresses, helped by duloxetine.      He notes some leg weakness, right worse than left, and he sometimes uses a cane if more tired.    He did PT with benefit in the past.   He stumbles with rare falls.   He notes some right leg spasticity.  He gets cramps at times, helped by tumeric. [name]  He has numbness and dysesthesias.     Bladder is doing better with tamsulosin.    Vision is stable.    He has vertigo and sometimes notes nausea.     He has chronic migraines occurring daily for > 4 hours a day.    Due to high co-pay, he never did Botox.    He has left CTS and the injection helped x 4 months with pain just starting to come back.   He snores and has EDS,    He was diagnosed with OSA in the past (15 years ago) but could not tolerate the mask (was a FFM).       Update 01/05/2018: He feels his MS is stable.   His next infusion should be at the end of the month.      He has aches and pains in the neck and back  ans sometimes the chest and legs.   These fluctuate but not necessarily associated with activity.     The lower back pain on the right is the worse.  He notes it more at night in bed.  Thgere is radiation into the buttock.   He takes baclofen 20 mg po tid, gabapentin 600 mg po tid.[name]   He stays active but does not exercise.     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   The splint seemed to aggravate the pain so he no longer uses.      He has daily headaches with migrainous features.   He took Ajovy samples but there was no benefit.   He used to be on Botox for migraine and had a good benefit .   He also has been on multiple other prophylactic agents without benefit including antiepileptic agents, muscle relaxants, NSAIDs and beta-blockers.  EPWORTH SLEEPINESS SCALE  On a scale of 0 - 3 what is the chance of dozing:  Sitting and Reading:   3 Watching TV:  3 Sitting inactive in a public place: 3 Passenger in car for one hour: 2 Lying down to rest in the afternoon: 3 Sitting and talking to someone: 0 Sitting quietly after lunch:  3 In a car, stopped in traffic:  0  Total (out of 24):   17/24   Moderate EDS   Update 07/07/2017:    Earlier this year, he switched from [place] to ocrelizumab due to [place] being removed from the market because of multiple cases of encephalitis. He follow through with the liver testing for the next 6 months and liver tests were fine. He tolerated the ocrelizumab infusion well but had a pneumonia and cough afterwards.   He sees ENT about this.   He has not had any definite exacerbation though he feels his balance is doing worse.   He also notes more numbness in his arms that is painful.     The numbness sometimes awakens him at night.    This occurs left > right.    He feels weak when the tingling is more severe.      Flomax helps the bladder hesitancy some but not always.    Fatigue fluctuates and is  worse with temperature extremes.    He sleeps poorly between the headaches and numbness.     He notes mild depression.     Migraines are still occurring on a daily basis (30/30 days a month for greater than 4 hours a day).  Unfortunately, his co-pay for Botox is very high through his insurance.    We discussed Ajovy, one of the anti-CGRP compounds.  The migraines are associated with throbbing pain that occurs on the right side more than the left. He has not received benefit from a generative medications though he did receive a benefit from Botox in the past. Triptan's did not help to reduce headache pain when present.   ___________________________ From 12/09/2016:  MS:   He has been on [place] since late 2016 and he tolerates it well.  It was just taken off the market due to encephalitis cases reported.    LFT's have been normal.  He denies any skin symptoms, abdominal symptoms or significant neurologic changes.  His MS appears to be stable. He did not have any exacerbations while on [name]. However, he did not do as well and he was on Betaseron or Gilenya I have reviewed the MRI of the brain dated 05/27/2016 and compared it with the 05/02/2015 MRI. There are no changes in the MRI over the interval and changes are consistent with multiple sclerosis. No acute findings. 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  His headache pain is 8/10.   Pain is throbbing and more often on the right side of his head. When pain is more severe he will get nausea, photophobia and phonophobia. In the past, Botox was very effective in controlling these headaches.   He has not received much benefit as much from other preventative medicines (see below).   Imitrex and other triptans have not helped reduce headaches.     Chronic migraine prophylactic medications tried and failed:   He has  tried and failed antiepileptic (Topiramate, Depakote IV, Keppra), NSAIDs (various), opiates (various), Tricyclics (amitriptyline, nortriptyline), muscle relaxants (tizanidine, baclofen).  Triptans (several different) have not helped relieve the headaches.    Gait/strength/sensation:    He stumbles and has  had rare falls. He feels his gait is off more because of balance then due to strength.. He reports numbness in the left > righ side of his body with painful dysesthesias, helped by Lyrica. The dysesthesias are worse in cold weather.  He stopped Ampyra as he is was not getting a benefit in walking.   Legs feel tight at times.    Bladder/bowel: He reports urinary hesitancy is better since starting tamsulosin and he has less nocturia.    He has had constipation and often uses enema.    We discussed using MiraLAX.  Vision: He reports some intermittent blurry vision. He wears glasses.  No diplopia.  Fatigue/Sleep:  He reports physical and cognitive fatigue, worse as the day goes on.   Heat has not greatly affected the fatigue.  Amantadine has not helped much. He was once tried on Ritalin but it did not help his focus and he became irritable so he stopped.   . He has insomnia, sleep maintenance most nights and sleep onset issues some nights..   Mood: He reports depression and anxiety and has had panic attacks. The panic attacks can be worse at night.   He does not think increasing the duloxetine has made much difference.  Cognition:   He reports difficulties with cognition. Specifically he has noted difficulty with short-term memory, reduce focus and attention, verbal fluency and executive function. .  MS History:   He presented in 2003 with numbness that started in the left foot and then progress to the entire left side over a period of about a month. He was initially worked up but there was not enough evidence to conclusively call him MS. He was referred to [facility] and saw Dr. Caryn Section. 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 2008, he went on Tysabri and stayed on Tysabri for about 4 or 5 years. He tolerated it very well and felt that he was stable while he was on it. 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 He switched to Gilenya. He more recently moved to this area. Dr. Sherryll Burger retested him and he is JCV Ab high posititve and placed him on [name].     Data personally reviewed the MRI of the brain dated 05/02/2015. It shows multiple white matter lesions, many of them in the periventricular white matter, some radially oriented to the ventricles. There is a focus within the right cerebellar hemisphere, as well. There are no enhancing lesions. The MRI of the cervical spine from the same day shows a normal spinal cord. At C6-C7 there is L > R foraminal narrowing due to degenerative changes  with some encroachment on the left C7 nerve root.  REVIEW OF SYSTEMS: Constitutional: No fevers, chills, sweats, or change in appetite.   He reports fatigue and poor sleep. Eyes: No visual changes, double vision, eye pain Ear, nose and throat: No hearing loss, ear pain, nasal congestion, sore throat Cardiovascular: No chest pain, palpitations Respiratory: No shortness of breath at rest or with exertion.   No wheezes GastrointestinaI: No nausea, vomiting, diarrhea, abdominal pain, fecal incontinence Genitourinary: as above. Musculoskeletal.    he reports pain in the neck, back and many of his muscles. Integumentary: No rash, pruritus, skin lesions Neurological: as above Psychiatric: Notes depression and anxiety Endocrine: No palpitations, diaphoresis, change in appetite, change in weigh  or increased thirst Hematologic/Lymphatic: No anemia, purpura, petechiae. Allergic/Immunologic: No itchy/runny eyes, nasal congestion, recent  allergic reactions, rashes  ALLERGIES: Allergies  Allergen Reactions  . Glucosamine Forte [Nutritional Supplements]   . Sulfa Antibiotics   . Sulfur Other (See Comments)    Severe Headaches  . Tapentadol Itching  . Clonazepam Rash  . Imipramine Rash  . Penicillins Rash    HOME MEDICATIONS:  Current Outpatient Medications:  .  amantadine (SYMMETREL) 100 MG capsule, TAKE ONE CAPSULE BY MOUTH TWICE A DAY, Disp: 180 capsule, Rfl: 1 .  baclofen (LIORESAL) 20 MG tablet, TAKE 1 TABLET BY MOUTH THREE TIMES A DAY, Disp: 90 tablet, Rfl: 11 .  DULoxetine (CYMBALTA) 60 MG capsule, TAKE 1 CAPSULE BY MOUTH EVERY DAY, Disp: 30 capsule, Rfl: 11 .  gabapentin (NEURONTIN) 600 MG tablet, Take 1 tablet (600 mg total) by mouth 3 (three) times daily., Disp: 270 tablet, Rfl: 1 .  HYDROcodone-acetaminophen (NORCO/VICODIN) 5-325 MG tablet, Take 1 tablet by mouth every 6 (six) hours as needed for moderate pain., Disp: 15 tablet, Rfl: 0 .  levETIRAcetam (KEPPRA) 500 MG tablet, One po tid, Disp: 90 tablet, Rfl: 5 .  modafinil (PROVIGIL) 200 MG tablet, TAKE 1 TABLET BY MOUTH ONCE DAILY, Disp: 90 tablet, Rfl: 0 .  ocrelizumab 600 mg in sodium chloride 0.9 % 500 mL, Inject 600 mg into the vein every 6 (six) months., Disp: , Rfl:  .  tamsulosin (FLOMAX) 0.4 MG CAPS capsule, TAKE 1 CAPSULE BY MOUTH EVERY DAY, Disp: 90 capsule, Rfl: 3 .  LINZESS 145 MCG CAPS capsule, TAKE 1 CAPSULE (145 MCG TOTAL) BY MOUTH ONCE DAILY TAKE 30 MINS BEFORE FOOD, Disp: , Rfl: 3 No current facility-administered medications for this visit.   Facility-Administered Medications Ordered in Other Visits:  .  gadopentetate dimeglumine (MAGNEVIST) injection 20 mL, 20 mL, Intravenous, Once PRN, Sater, Pearletha Furl, MD  PAST MEDICAL HISTORY: Past Medical History:  Diagnosis Date  . Depression   . Headache   . Hearing loss   . Memory loss   . Multiple sclerosis (HCC)   . Vision abnormalities     PAST SURGICAL HISTORY: Past Surgical History:   Procedure Laterality Date  . COLONOSCOPY WITH PROPOFOL N/A 07/01/2015   Procedure: COLONOSCOPY WITH PROPOFOL;  Surgeon: Christena Deem, MD;  Location: [facility] ENDOSCOPY;  Service: Endoscopy;  Laterality: N/A;  . varicose seals      FAMILY HISTORY: Family History  Problem Relation Age of Onset  . Lung cancer Mother   . Heart disease Father   . Prostate cancer Father   . Diabetes type II Father     SOCIAL HISTORY:  Social History   Socioeconomic History  . Marital status: Married    Spouse name: Not on file  . Number of children: Not on file  . Years of education: Not on file  . Highest education level: Not on file  Occupational History  . Not on file  Social Needs  . Financial resource strain: Not on file  . Food insecurity:    Worry: Not on file    Inability: Not on file  . Transportation needs:    Medical: Not on file    Non-medical: Not on file  Tobacco Use  . Smoking status: Never Smoker  . Smokeless tobacco: Never Used  Substance and Sexual Activity  . Alcohol use: No  . Drug use: No  . Sexual activity: Not on file  Lifestyle  . Physical activity:    Days  per week: Not on file    Minutes per session: Not on file  . Stress: Not on file  Relationships  . Social connections:    Talks on phone: Not on file    Gets together: Not on file    Attends religious service: Not on file    Active member of club or organization: Not on file    Attends meetings of clubs or organizations: Not on file    Relationship status: Not on file  . Intimate partner violence:    Fear of current or ex partner: Not on file    Emotionally abused: Not on file    Physically abused: Not on file    Forced sexual activity: Not on file  Other Topics Concern  . Not on file  Social History Narrative  . Not on file     PHYSICAL EXAM  Vitals:   07/05/18 1346  BP: 123/74  Pulse: 78  Resp: 18  Weight: 263 lb 8 oz (119.5 kg)  Height: 5\' 11"  (1.803 m)    Body mass index is 36.75  kg/m.   General: The patient is well-developed and well-nourished and in no acute distress   Skin: Extremities are without significant edema or rash.  Other:   He has mild occipital tenderness.  Ok ROM   Neurologic Exam  Mental status: The patient is alert and oriented x 3 at the time of the examination. The patient has apparent normal recent and remote memory, with mildly reduced attention span and concentration ability.   Speech is normal.  Cranial nerves: Extraocular muscles are normal.  There is mild reduced facial sensation on the left.  Facial strength and trapezius strength are normal.   No dysarthria is noted.  The tongue is midline, and the patient has symmetric elevation of the soft palate. No obvious hearing deficits are noted.  Motor:  Muscle bulk is normal.   Tone is increased in legs. Strength is  5 / 5 in arms and proximally in legs. Strength was 4+/5 distally in the legs and 4/5 in the APB muscle on the left   Sensory:   On sensory testing, he has reduced sensation to touch in the left arm and leg and reduced vibration sensation on the right.   There is superimposed further numbness in the left hand over the thenar eminence  Coordination: Cerebellar testing reveals good finger-nose-finger and reduced heel-to-shin bilaterally.  Gait and station: Station is normal.  The gait is wide and spastic.  The tandem gait is poor.  Reflexes: Deep tendon reflexes are symmetric and increased in legs bilaterally.   No ankle clonus.    DIAGNOSTIC DATA (LABS, IMAGING, TESTING) - I reviewed patient records, labs, notes, testing and imaging myself where available.      ASSESSMENT AND PLAN  No diagnosis found.     1.   Continue Ocrelizumab for MS.  The next infusion will be in about 3-4 weeks..      2.   Emgality for chronic migraine.   In the past, his migraines were best controlled with Botox but insurance does not cover without very high copay.  We will try to get him  authorized for this treatment again.   3.   Continue other medications. 4.   PSG split night for known OSA, EDS.   5.    Return in 6 months for an MS follow-up (may need sooner for OSA).  Call sooner if there are new or worsening neurologic symptoms.  40 minute face to face interaction with > 1/2 the time counseling and coordinating care about his MS symptoms, probable OSA, fatigue, migraine and other issues  Richard A. Epimenio Foot, MD, PhD, FAAN Certified in Neurology, Clinical Neurophysiology, Sleep Medicine, Pain Medicine and Neuroimaging Director, [facility]  [facility] [address], [address] [place], [place] [address] [phone]

## 2018-07-06 ENCOUNTER — Telehealth: Payer: Self-pay | Admitting: *Deleted

## 2018-07-06 LAB — CBC WITH DIFFERENTIAL/PLATELET
BASOS ABS: 0.2 10*3/uL (ref 0.0–0.2)
Basos: 2 %
EOS (ABSOLUTE): 0.3 10*3/uL (ref 0.0–0.4)
Eos: 4 %
HEMOGLOBIN: 15 g/dL (ref 13.0–17.7)
Hematocrit: 44.1 % (ref 37.5–51.0)
IMMATURE GRANS (ABS): 0 10*3/uL (ref 0.0–0.1)
IMMATURE GRANULOCYTES: 0 %
LYMPHS: 20 %
Lymphocytes Absolute: 1.6 10*3/uL (ref 0.7–3.1)
MCH: 30.8 pg (ref 26.6–33.0)
MCHC: 34 g/dL (ref 31.5–35.7)
MCV: 91 fL (ref 79–97)
MONOCYTES: 7 %
Monocytes Absolute: 0.6 10*3/uL (ref 0.1–0.9)
Neutrophils Absolute: 5.4 10*3/uL (ref 1.4–7.0)
Neutrophils: 67 %
PLATELETS: 290 10*3/uL (ref 150–450)
RBC: 4.87 x10E6/uL (ref 4.14–5.80)
RDW: 12.2 % — ABNORMAL LOW (ref 12.3–15.4)
WBC: 8.1 10*3/uL (ref 3.4–10.8)

## 2018-07-06 LAB — COMPREHENSIVE METABOLIC PANEL
ALBUMIN: 4.4 g/dL (ref 3.5–5.5)
ALT: 18 IU/L (ref 0–44)
AST: 19 IU/L (ref 0–40)
Albumin/Globulin Ratio: 1.8 (ref 1.2–2.2)
Alkaline Phosphatase: 98 IU/L (ref 39–117)
BILIRUBIN TOTAL: 0.3 mg/dL (ref 0.0–1.2)
BUN/Creatinine Ratio: 17 (ref 9–20)
BUN: 15 mg/dL (ref 6–24)
CALCIUM: 9.4 mg/dL (ref 8.7–10.2)
CHLORIDE: 100 mmol/L (ref 96–106)
CO2: 24 mmol/L (ref 20–29)
CREATININE: 0.88 mg/dL (ref 0.76–1.27)
GFR calc non Af Amer: 97 mL/min/{1.73_m2} (ref >59)
GFR, EST AFRICAN AMERICAN: 113 mL/min/{1.73_m2} (ref >59)
GLUCOSE: 88 mg/dL (ref 65–99)
Globulin, Total: 2.5 g/dL (ref 1.5–4.5)
Potassium: 5.2 mmol/L (ref 3.5–5.2)
Sodium: 141 mmol/L (ref 134–144)
TOTAL PROTEIN: 6.9 g/dL (ref 6.0–8.5)

## 2018-07-06 LAB — TSH: TSH: 1.89 u[IU]/mL (ref 0.450–4.500)

## 2018-07-06 NOTE — Telephone Encounter (Signed)
-----   Message from Asa Lente, MD sent at 07/06/2018  9:30 AM EDT ----- Please let the patient know that the lab work is fine.

## 2018-07-06 NOTE — Telephone Encounter (Signed)
Spoke with [name] and reviewed below lab results.  He verbalized understanding of same/fim

## 2018-07-07 ENCOUNTER — Ambulatory Visit: Payer: Medicare HMO | Admitting: Neurology

## 2018-07-11 ENCOUNTER — Telehealth: Payer: Self-pay

## 2018-07-11 DIAGNOSIS — G4719 Other hypersomnia: Secondary | ICD-10-CM

## 2018-07-11 DIAGNOSIS — R0683 Snoring: Secondary | ICD-10-CM

## 2018-07-11 DIAGNOSIS — G4733 Obstructive sleep apnea (adult) (pediatric): Secondary | ICD-10-CM

## 2018-07-11 NOTE — Telephone Encounter (Signed)
[employer] denied in lab sleep study. They want [place] to do a HST. Need order.

## 2018-07-19 NOTE — Telephone Encounter (Signed)
Parker [name]: [id number] (exp. 07/18/18 to 10/16/18) patient is scheduled at GI for 07/20/18.

## 2018-07-20 ENCOUNTER — Other Ambulatory Visit: Payer: Medicare HMO

## 2018-07-22 ENCOUNTER — Ambulatory Visit
Admission: RE | Admit: 2018-07-22 | Discharge: 2018-07-22 | Disposition: A | Discharge status: Home / Self Care | Payer: Medicare HMO | Source: Ambulatory Visit | Attending: Neurology | Admitting: Neurology

## 2018-07-22 DIAGNOSIS — G35 Multiple sclerosis: Secondary | ICD-10-CM

## 2018-07-22 MED ORDER — GADOBENATE DIMEGLUMINE 529 MG/ML IV SOLN
20.0000 mL | Freq: Once | INTRAVENOUS | Status: AC | PRN
  Administered 2018-07-22: 20 mL via INTRAVENOUS

## 2018-07-25 ENCOUNTER — Telehealth: Payer: Self-pay | Admitting: *Deleted

## 2018-07-25 NOTE — Telephone Encounter (Signed)
-----   Message from Asa Lente, MD sent at 07/22/2018  6:00 PM EDT ----- Please let the patient know that the MRI looks good -- no new MS lesions

## 2018-07-25 NOTE — Telephone Encounter (Signed)
Called pt, Advised MRI looks good, no new MS lesions per Dr. Epimenio Foot. He verbalized understanding and appreciation for call.

## 2018-08-02 ENCOUNTER — Telehealth: Payer: Self-pay | Admitting: Neurology

## 2018-08-02 NOTE — Telephone Encounter (Signed)
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  Pt is asking for a call back to discuss getting that injection now for both his left and right hand, please call

## 2018-08-02 NOTE — Telephone Encounter (Signed)
Called patient. Offered appt's tomorrow. He accepted appt tomorrow at 930am, check in 900am. Asked him to bring copay, insurance cards and updated med list with him.   Per Dr. Epimenio Foot- he will use decadron and lidocaine for inj.

## 2018-08-02 NOTE — Telephone Encounter (Signed)
Spoke with Dr. Epimenio Foot and he can work him in tomorrow at either 930am or 430pm for injections. I will call patient

## 2018-08-03 ENCOUNTER — Ambulatory Visit (INDEPENDENT_AMBULATORY_CARE_PROVIDER_SITE_OTHER): Payer: Medicare HMO | Admitting: Neurology

## 2018-08-03 ENCOUNTER — Ambulatory Visit: Payer: Medicare HMO | Admitting: Neurology

## 2018-08-03 ENCOUNTER — Encounter: Payer: Self-pay | Admitting: Neurology

## 2018-08-03 VITALS — BP 128/86 | HR 87 | Wt 273.0 lb

## 2018-08-03 DIAGNOSIS — G5602 Carpal tunnel syndrome, left upper limb: Secondary | ICD-10-CM

## 2018-08-03 DIAGNOSIS — G4733 Obstructive sleep apnea (adult) (pediatric): Secondary | ICD-10-CM | POA: Insufficient documentation

## 2018-08-03 DIAGNOSIS — G35 Multiple sclerosis: Secondary | ICD-10-CM | POA: Diagnosis not present

## 2018-08-03 DIAGNOSIS — G4719 Other hypersomnia: Secondary | ICD-10-CM

## 2018-08-03 DIAGNOSIS — R0683 Snoring: Secondary | ICD-10-CM

## 2018-08-03 NOTE — Progress Notes (Signed)
[facility]  PATIENT: Parker [name] DOB: 1965-10-18  REFERRING DOCTOR OR PCP:  Dr. Cristopher Peru (fax  [phone]) SOURCE: patient, notes, MRI reports and MRI images on PACS  _________________________________   HISTORICAL  CHIEF COMPLAINT:  Chief Complaint  Patient presents with  . Follow-up    RM 13, alone. Last seen 07/05/18. Here for bilateral inj for his CTS.   . Injections    Dexamethasone: Lot: [id number], exp: 02/2019, NDC: 62130-865-78. Lidocaine2%: lot: [id number], exp: 06/2021, NDC: 28413-244-01.    HISTORY OF PRESENT ILLNESS:  Parker [name] is a 52 y.o. man with multiple sclerosis and chronic migraine   Update 08/03/2018: His carpal tunnel syndrome has been bothering him more.  He has numbness in the left hand that will sometimes awaken him at night.  He notes slightly reduced strength.  He also has pain at the right wrist with occasional numbness going into the hand, but less than on the left.  He had a diagnosis of obstructive sleep apnea in the past but was unable to tolerate CPAP (15 years ago).  We will check a home sleep study and set him up for CPAP based on the results.  He feels his MS has been stable and he has no new symptoms.  He is on ocrelizumab.  Update 07/05/2018: He feels mostly stable.    He is on ocrelizumab.   He tolerates it well.   He has no definite exacerbation but notes some worsening symptoms  He is having more trouble with fatigue.    He notes this is physical > cognitive.    He is sleeping 4-6 hours sleep most nights.   He has more trouble falling asleep than falling asleep.    He sleeps worse when his back is hurting more.    He gets up earlier than he used to.   He feels mildly depresses, helped by duloxetine.      He notes some leg weakness, right worse than left, and he sometimes uses a cane if more tired.    He did PT with benefit in the past.   He stumbles with rare falls.   He notes some right leg spasticity.  He gets cramps  at times, helped by tumeric. [name]  He has numbness and dysesthesias.     Bladder is doing better with tamsulosin.    Vision is stable.    He has vertigo and sometimes notes nausea.     He has chronic migraines occurring daily for > 4 hours a day.    Due to high co-pay, he never did Botox.    He has left CTS and the injection helped x 4 months with pain just starting to come back.   He snores and has EDS,    He was diagnosed with OSA in the past (15 years ago) but could not tolerate the mask (was a FFM).       Update 01/05/2018: He feels his MS is stable.   His next infusion should be at the end of the month.      He has aches and pains in the neck and back ans sometimes the chest and legs.   These fluctuate but not necessarily associated with activity.     The lower back pain on the right is the worse.  He notes it more at night in bed.  Thgere is radiation into the buttock.   He takes baclofen 20 mg po tid, gabapentin 600 mg po tid.[name]  He stays active but does not exercise.     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   The splint seemed to aggravate the pain so he no longer uses.      He has daily headaches with migrainous features.   He took Ajovy samples but there was no benefit.   He used to be on Botox for migraine and had a good benefit .   He also has been on multiple other prophylactic agents without benefit including antiepileptic agents, muscle relaxants, NSAIDs and beta-blockers.  EPWORTH SLEEPINESS SCALE  On a scale of 0 - 3 what is the chance of dozing:  Sitting and Reading:   3 Watching TV:    3 Sitting inactive in a public place: 3 Passenger in car for one hour: 2 Lying down to rest in the afternoon: 3 Sitting and talking to someone: 0 Sitting quietly after lunch:  3 In a car, stopped in traffic:  0  Total (out of 24):   17/24   Moderate EDS   Update 07/07/2017:    Earlier this year, he switched from  [place] to ocrelizumab due to [place] being removed from the market because of multiple cases of encephalitis. He follow through with the liver testing for the next 6 months and liver tests were fine. He tolerated the ocrelizumab infusion well but had a pneumonia and cough afterwards.   He sees ENT about this.   He has not had any definite exacerbation though he feels his balance is doing worse.   He also notes more numbness in his arms that is painful.     The numbness sometimes awakens him at night.    This occurs left > right.    He feels weak when the tingling is more severe.      Flomax helps the bladder hesitancy some but not always.    Fatigue fluctuates and is worse with temperature extremes.    He sleeps poorly between the headaches and numbness.     He notes mild depression.     Migraines are still occurring on a daily basis (30/30 days a month for greater than 4 hours a day).  Unfortunately, his co-pay for Botox is very high through his insurance.    We discussed Ajovy, one of the anti-CGRP compounds.  The migraines are associated with throbbing pain that occurs on the right side more than the left. He has not received benefit from a generative medications though he did receive a benefit from Botox in the past. Triptan's did not help to reduce headache pain when present.   ___________________________ From 12/09/2016:  MS:   He has been on [place] since late 2016 and he tolerates it well.  It was just taken off the market due to encephalitis cases reported.    LFT's have been normal.  He denies any skin symptoms, abdominal symptoms or significant neurologic changes.  His MS appears to be stable. He did not have any exacerbations while on [name]. However, he did not do as well and he was on Betaseron or Gilenya I have reviewed the MRI of the brain dated 05/27/2016 and compared it with the 05/02/2015 MRI. There are no changes in the MRI over the interval and changes are consistent with multiple  sclerosis. No acute findings. 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  His headache pain is 8/10.   Pain is throbbing and more often on the right side of his head. When pain is more severe he will get nausea, photophobia and phonophobia. In the past, Botox was very effective in controlling these headaches.   He has not received much benefit as much from other preventative medicines (see below).   Imitrex and other triptans have not helped reduce headaches.     Chronic migraine prophylactic medications tried and failed:   He has tried and failed antiepileptic (Topiramate, Depakote IV, Keppra), NSAIDs (various), opiates (various), Tricyclics (amitriptyline, nortriptyline), muscle relaxants (tizanidine, baclofen).  Triptans (several different) have not helped relieve the headaches.    Gait/strength/sensation:    He stumbles and has had rare falls. He feels his gait is off more because of balance then due to strength.. He reports numbness in the left > righ side of his body with painful dysesthesias, helped by Lyrica. The dysesthesias are worse in cold weather.  He stopped Ampyra as he is was not getting a benefit in walking.   Legs feel tight at times.    Bladder/bowel: He reports urinary hesitancy is better since starting tamsulosin and he has less nocturia.    He has had constipation and often uses enema.    We discussed using MiraLAX.  Vision: He reports some intermittent blurry vision. He wears glasses.  No diplopia.  Fatigue/Sleep:  He reports physical and cognitive fatigue, worse as the day goes on.   Heat has not greatly affected the fatigue.  Amantadine has not helped much. He was once tried on Ritalin but it did not help his focus and he became irritable so he stopped.   . He has insomnia, sleep maintenance most nights and sleep onset issues some nights..    Mood: He reports depression and anxiety and has had panic attacks. The panic attacks can be worse at night.   He does not think increasing the duloxetine has made much difference.  Cognition:   He reports difficulties with cognition. Specifically he has noted difficulty with short-term memory, reduce focus and attention, verbal fluency and executive function. .  MS History:   He presented in 2003 with numbness that started in the left foot and then progress to the entire left side over a period of about a month. He was initially worked up but there was not enough evidence to conclusively call him MS. He was referred to [facility] clinic and saw Dr. Caryn Section. 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 2008, he went on Tysabri and stayed on Tysabri for about 4 or 5 years. He tolerated it very well and felt that he was stable while he was on it. 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 He switched to Gilenya. He more recently moved to this area. Dr. Sherryll Burger retested him and he is JCV Ab high posititve and placed him on [name].     Data personally reviewed the MRI of the brain dated 05/02/2015. It shows multiple white matter lesions, many of them in the periventricular white matter, some radially oriented to the ventricles. There is a focus within the right cerebellar hemisphere, as well. There are no enhancing lesions. The MRI of the cervical spine from the same day shows a normal spinal cord. At C6-C7 there is L >  R foraminal narrowing due to degenerative changes  with some encroachment on the left C7 nerve root.  REVIEW OF SYSTEMS: Constitutional: No fevers, chills, sweats, or change in appetite.   He reports fatigue and poor sleep. Eyes: No visual changes, double vision, eye pain Ear, nose and throat: No hearing loss, ear  pain, nasal congestion, sore throat Cardiovascular: No chest pain, palpitations Respiratory: No shortness of breath at rest or with exertion.   No wheezes GastrointestinaI: No nausea, vomiting, diarrhea, abdominal pain, fecal incontinence Genitourinary: as above. Musculoskeletal.    he reports pain in the neck, back and many of his muscles. Integumentary: No rash, pruritus, skin lesions Neurological: as above Psychiatric: Notes depression and anxiety Endocrine: No palpitations, diaphoresis, change in appetite, change in weigh or increased thirst Hematologic/Lymphatic: No anemia, purpura, petechiae. Allergic/Immunologic: No itchy/runny eyes, nasal congestion, recent allergic reactions, rashes  ALLERGIES: Allergies  Allergen Reactions  . Glucosamine Forte [Nutritional Supplements]   . Sulfa Antibiotics   . Sulfur Other (See Comments)    Severe Headaches  . Tapentadol Itching  . Clonazepam Rash  . Imipramine Rash  . Penicillins Rash    HOME MEDICATIONS:  Current Outpatient Medications:  .  amantadine (SYMMETREL) 100 MG capsule, TAKE ONE CAPSULE BY MOUTH TWICE A DAY, Disp: 180 capsule, Rfl: 1 .  baclofen (LIORESAL) 20 MG tablet, TAKE 1 TABLET BY MOUTH THREE TIMES A DAY, Disp: 90 tablet, Rfl: 11 .  DULoxetine (CYMBALTA) 60 MG capsule, TAKE 1 CAPSULE BY MOUTH EVERY DAY, Disp: 30 capsule, Rfl: 11 .  gabapentin (NEURONTIN) 600 MG tablet, Take 1 tablet (600 mg total) by mouth 3 (three) times daily., Disp: 270 tablet, Rfl: 1 .  Galcanezumab-gnlm (EMGALITY) 120 MG/ML SOAJ, Inject 1 pen into the skin every 28 (twenty-eight) days., Disp: 3 pen, Rfl: 4 .  HYDROcodone-acetaminophen (NORCO/VICODIN) 5-325 MG tablet, Take 1 tablet by mouth every 6 (six) hours as needed for moderate pain., Disp: 15 tablet, Rfl: 0 .  levETIRAcetam (KEPPRA) 500 MG tablet, One po tid, Disp: 90 tablet, Rfl: 5 .  LINZESS 145 MCG CAPS capsule, TAKE 1 CAPSULE (145 MCG TOTAL) BY MOUTH ONCE DAILY TAKE 30 MINS BEFORE FOOD,  Disp: , Rfl: 3 .  meclizine (MEDI-MECLIZINE) 25 MG tablet, Take 1 tablet (25 mg total) by mouth 2 (two) times daily as needed for dizziness., Disp: 60 tablet, Rfl: 5 .  modafinil (PROVIGIL) 200 MG tablet, TAKE 1 TABLET BY MOUTH ONCE DAILY, Disp: 90 tablet, Rfl: 0 .  ocrelizumab 600 mg in sodium chloride 0.9 % 500 mL, Inject 600 mg into the vein every 6 (six) months., Disp: , Rfl:  .  tamsulosin (FLOMAX) 0.4 MG CAPS capsule, TAKE 1 CAPSULE BY MOUTH EVERY DAY, Disp: 90 capsule, Rfl: 3 No current facility-administered medications for this visit.   Facility-Administered Medications Ordered in Other Visits:  .  gadopentetate dimeglumine (MAGNEVIST) injection 20 mL, 20 mL, Intravenous, Once PRN, Yaiza Palazzola, Pearletha Furl, MD  PAST MEDICAL HISTORY: Past Medical History:  Diagnosis Date  . Depression   . Headache   . Hearing loss   . Memory loss   . Multiple sclerosis (HCC)   . Vision abnormalities     PAST SURGICAL HISTORY: Past Surgical History:  Procedure Laterality Date  . COLONOSCOPY WITH PROPOFOL N/A 07/01/2015   Procedure: COLONOSCOPY WITH PROPOFOL;  Surgeon: Christena Deem, MD;  Location: [facility] ENDOSCOPY;  Service: Endoscopy;  Laterality: N/A;  . varicose seals      FAMILY HISTORY: Family History  Problem  Relation Age of Onset  . Lung cancer Mother   . Heart disease Father   . Prostate cancer Father   . Diabetes type II Father     SOCIAL HISTORY:  Social History   Socioeconomic History  . Marital status: Married    Spouse name: Not on file  . Number of children: Not on file  . Years of education: Not on file  . Highest education level: Not on file  Occupational History  . Not on file  Social Needs  . Financial resource strain: Not on file  . Food insecurity:    Worry: Not on file    Inability: Not on file  . Transportation needs:    Medical: Not on file    Non-medical: Not on file  Tobacco Use  . Smoking status: Never Smoker  . Smokeless tobacco: Never Used   Substance and Sexual Activity  . Alcohol use: No  . Drug use: No  . Sexual activity: Not on file  Lifestyle  . Physical activity:    Days per week: Not on file    Minutes per session: Not on file  . Stress: Not on file  Relationships  . Social connections:    Talks on phone: Not on file    Gets together: Not on file    Attends religious service: Not on file    Active member of club or organization: Not on file    Attends meetings of clubs or organizations: Not on file    Relationship status: Not on file  . Intimate partner violence:    Fear of current or ex partner: Not on file    Emotionally abused: Not on file    Physically abused: Not on file    Forced sexual activity: Not on file  Other Topics Concern  . Not on file  Social History Narrative  . Not on file     PHYSICAL EXAM  Vitals:   08/03/18 0849  BP: 128/86  Pulse: 87  Weight: 273 lb (123.8 kg)    Body mass index is 38.08 kg/m.   General:   Patient is well-developed and well-nourished and in no acute distress   Extremities: He has mild tenderness to deep palpation at the Right wrist and has a Tinel sign at the left wrist.  Neurologic Exam  Mental status: The patient is alert and oriented . The patient has apparent normal recent and remote memory, with mildly reduced attention span and concentration ability.   Speech is normal.  Cranial nerves: Extraocular muscles are normal.     Motor:  Muscle bulk is normal.   Tone is increased in legs. Strength is  5 / 5 in arms and proximally in legs. Strength was 4+/5 distally in the legs and 4/5 in the APB muscle on the left   Sensory:   On sensory testing, there is numbness in the left hand over the thenar eminence  Coordination: Finger-nose-finger is performed well.  Gait and station: Station is normal.  The gait is wide and spastic.  The tandem gait is poor.     DIAGNOSTIC DATA (LABS, IMAGING, TESTING) - I reviewed patient records, labs, notes, testing  and imaging myself where available.      ASSESSMENT AND PLAN  No diagnosis found.     1.   Continue ocrelizumab for the multiple sclerosis.     2.   Continue Emgality for chronic migraine.    3.   Bilateral carpal tunnel injections with a  total of 2 mg Decadron in 1.5 cc lidocaine.   4.   He will be having his home sleep study tonight and CPAP will be ordered based on the results. 5.    Return in 6 months for an MS follow-up (may need sooner for OSA).  Call sooner if there are new or worsening neurologic symptoms.  40 minute face to face interaction with > 1/2 the time counseling and coordinating care about his MS symptoms, probable OSA, fatigue, migraine and other issues  Mykel Sponaugle A. Epimenio Foot, MD, PhD, FAAN Certified in Neurology, Clinical Neurophysiology, Sleep Medicine, Pain Medicine and Neuroimaging Director, [facility]  [facility] [address] [place], [place] [address] [phone]

## 2018-08-12 ENCOUNTER — Telehealth: Payer: Self-pay | Admitting: Neurology

## 2018-08-12 NOTE — Progress Notes (Signed)
     [facility] [address] [place], [place] [address] NAME: Parker [name]                                                                 DOB: 12-03-1965 MEDICAL RECORD [id number]                                               DOS:  08/03/18 REFERRING PHYSICIAN: Tiago Humphrey A. Epimenio Foot, MD STUDY PERFORMED: Home Sleep Study  HISTORY: Update 08/03/2018: He is a 52 year old man with multiple sclerosis, snoring and excessive daytime sleepiness.   He had a diagnosis of obstructive sleep apnea in the past but was unable to tolerate CPAP (15 years ago).  We will check a home sleep study and set him up for CPAP based on the results.  The Epworth Sleepiness Scale Score was 17/24 (moderately severe excessive daytime sleepiness).    He feels his MS has been stable and he has no new symptoms.  He is on ocrelizumab.  STUDY RESULTS:  Total Recording Time:  10 hours   34 minutes Total Apnea/Hypopnea Index (AHI):  11.3 /h        RDI: 12.5 /h Average Oxygen Saturation:   94%         Lowest Oxygen Desaturation:  86%  Total Time Oxygen Saturation Below or at 88 %:  0.6 minutes  Average Heart Rate:   81  bpm (between 61and 111  bpm) BMI:36.7  IMPRESSION: 1. Mild obstructive sleep apnea with an AHI equals 11.3 (and RDI equals 12.5) 2. Moderately severe excessive daytime sleepiness with an Epworth Sleepiness scale score of 17/24. RECOMMENDATION: 1. His mild OSA with moderate excessive daytime sleepiness could be treated with CPAP and he could return for a CPAP titration. 2. Alternatively, weight loss and and oral appliance could be tried. 3. Follow up with Dr. Epimenio Foot   I certify that I have reviewed the raw data recording prior to the issuance of this report in accordance with the standards of the American Academy of Sleep Medicine (AASM).      Despina Arias, MD, PhD [facility]) Diplomat, ABPN (Neurology and Sleep)

## 2018-08-12 NOTE — Telephone Encounter (Signed)
I called and left a message.  The home sleep study showed mild OSA (AHI equals 12).  As he has excessive daytime sleepiness, CPAP is an option and we could have him come in for an overnight study (or AutoPap if required by insurance)  Alternatively, since the OSA is mild we could also try weight loss and an oral appliance.

## 2018-08-19 NOTE — Telephone Encounter (Signed)
I spoke to Parker [name].  He would like to try weight loss and an oral appliance.    Please send him the name of a dentist or two that can make the appliances.

## 2018-10-14 ENCOUNTER — Other Ambulatory Visit: Payer: Self-pay | Admitting: Neurology

## 2018-10-21 IMAGING — CT CT ABD-PELV W/ CM
2 of 5 series · 16 of 46 positions shown, 18 images · IV contrast (iopamidol)
Comparison: None.

CLINICAL DATA: 51-year-old male presenting with epigastric and left
upper quadrant pain, early satiety. Reports bloody diarrhea 1 month
ago.

EXAM:
CT ABDOMEN AND PELVIS WITH CONTRAST
TECHNIQUE: Multidetector CT imaging of the abdomen and pelvis was performed
using the standard protocol following bolus administration of
intravenous contrast.
CONTRAST:  125mL [id number] IOPAMIDOL ([id number]-[id number]) INJECTION 61%

[Series 2: axial st · axial · 0.98mm/px · z∈[-378,+72]mm · 13 of 102 slices shown, 15 images]
[im 6/102  soft-tissue]
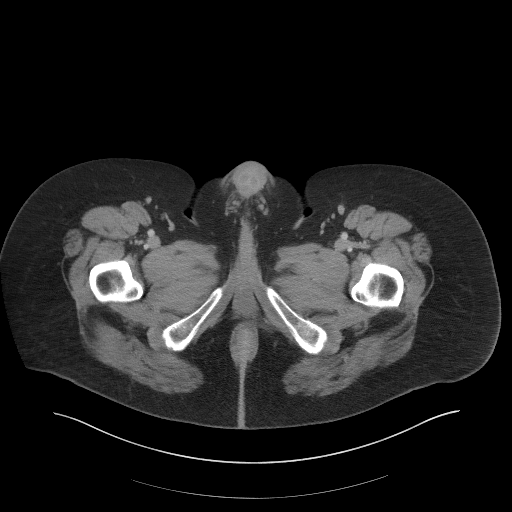
[im 6/102  bone]
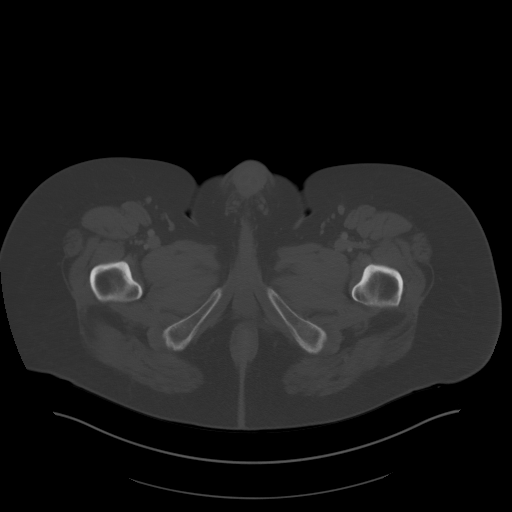
[im 16/102  soft-tissue]
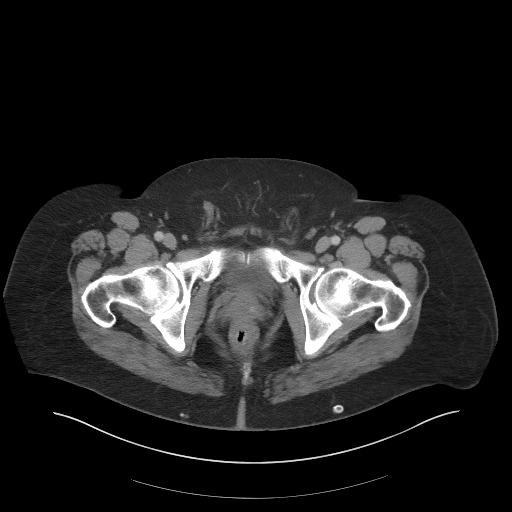
[im 22/102  soft-tissue]
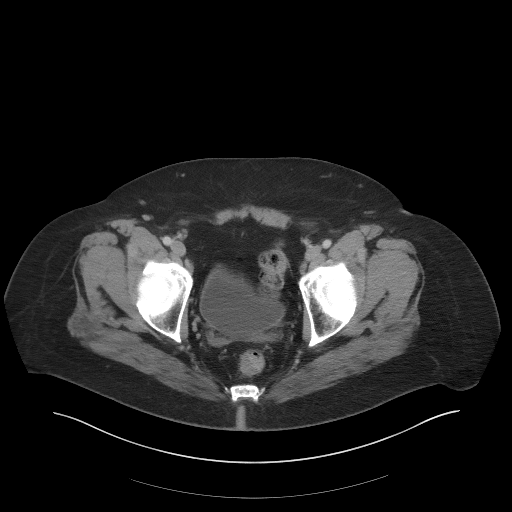
[im 27/102  soft-tissue]
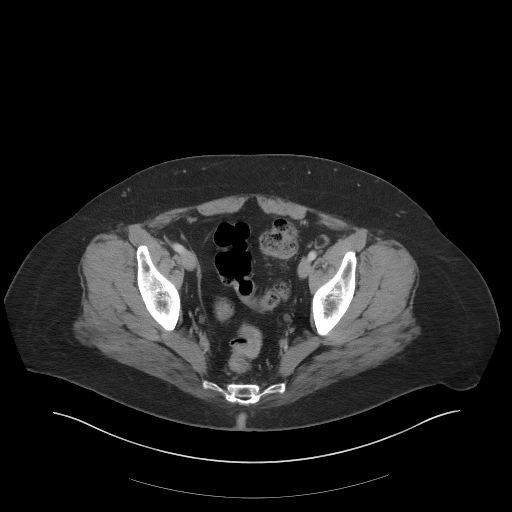
[im 38/102  soft-tissue]
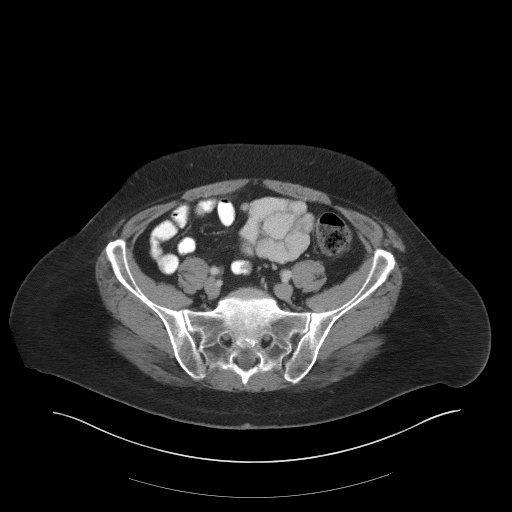
[im 43/102  soft-tissue]
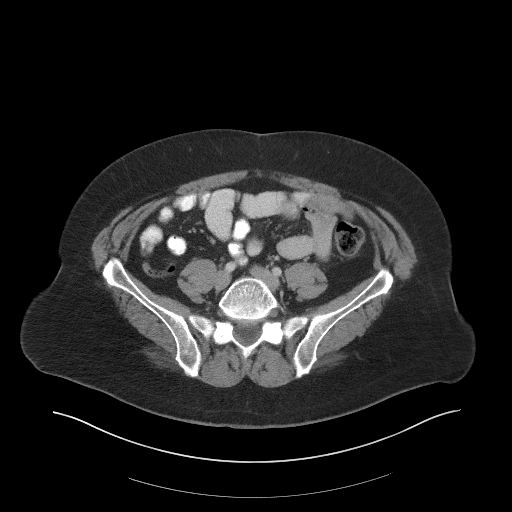
[im 54/102  soft-tissue]
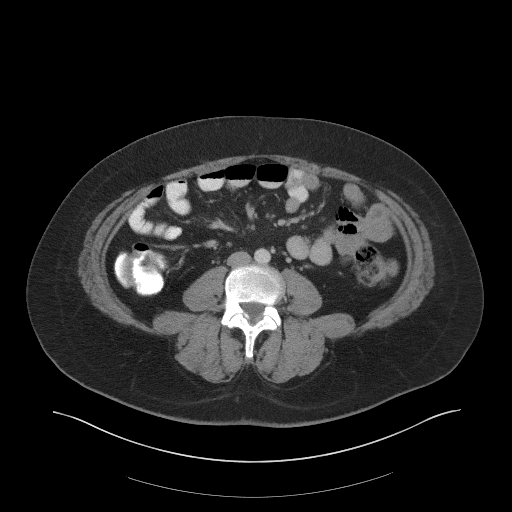
[im 59/102  soft-tissue]
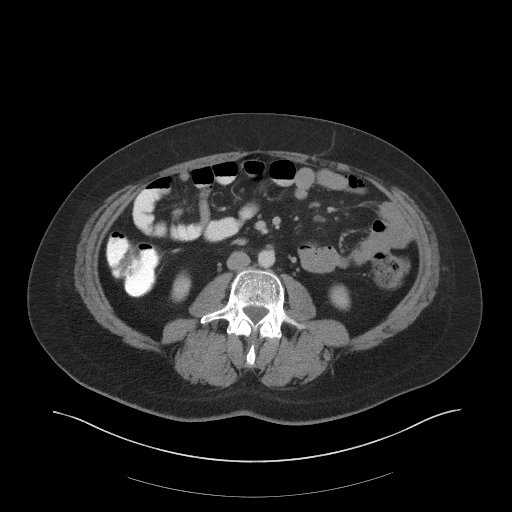
[im 64/102  soft-tissue]
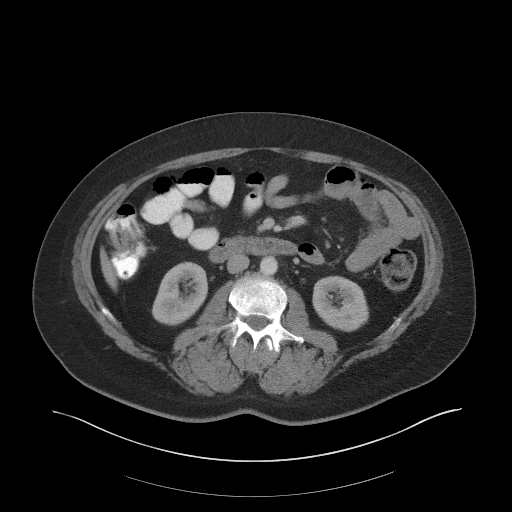
[im 64/102  bone]
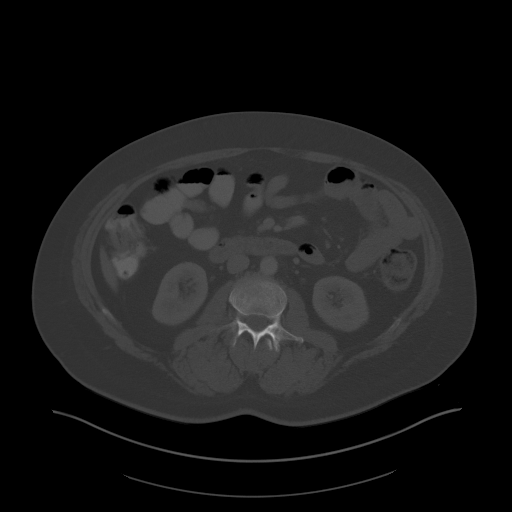
[im 75/102  soft-tissue]
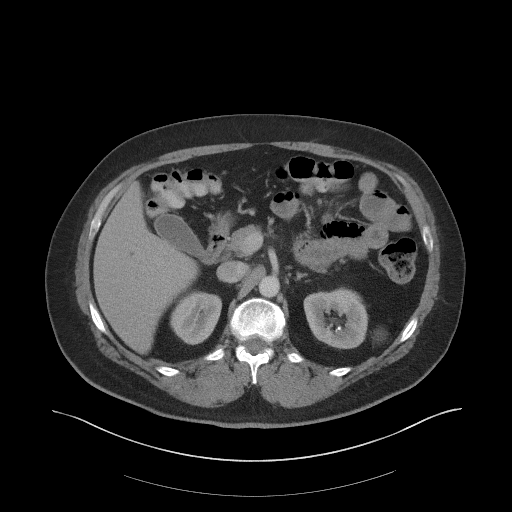
[im 80/102  soft-tissue]
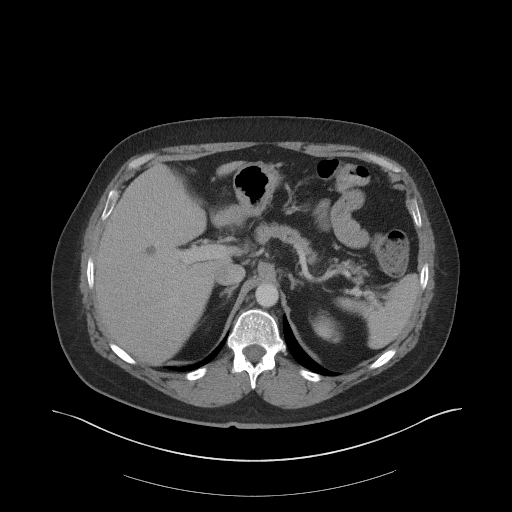
[im 86/102  soft-tissue]
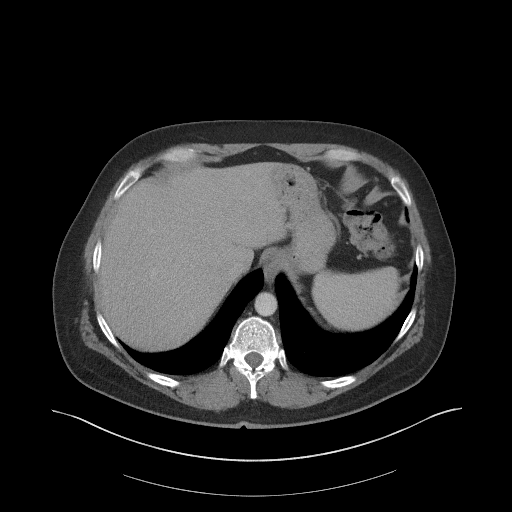
[im 96/102  soft-tissue]
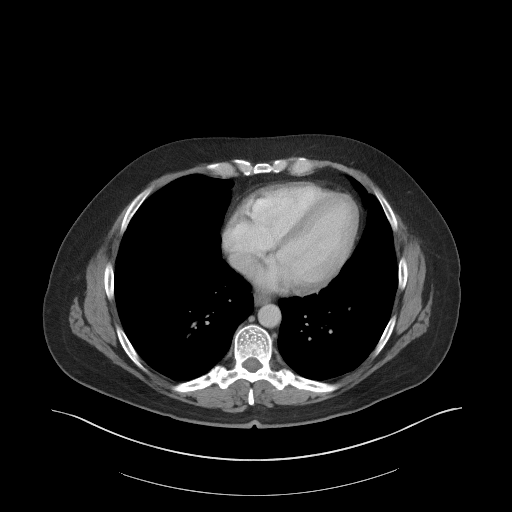

[Series 5: coronal st · coronal · 0.83mm/px · 3 of 99 slices shown]
[im 33/99  soft-tissue]
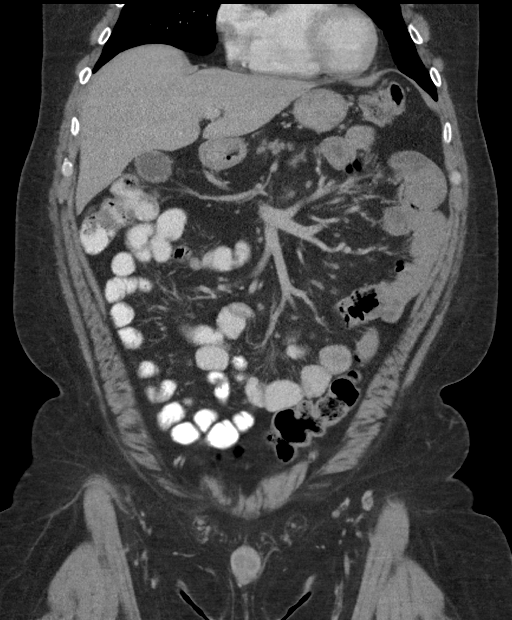
[im 44/99  soft-tissue]
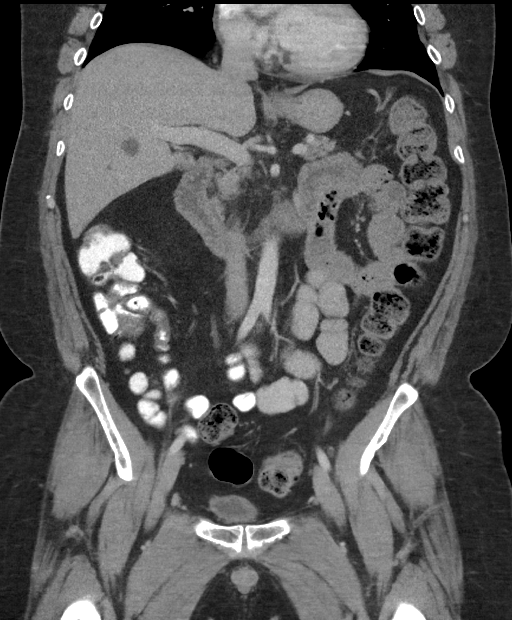
[im 55/99  soft-tissue]
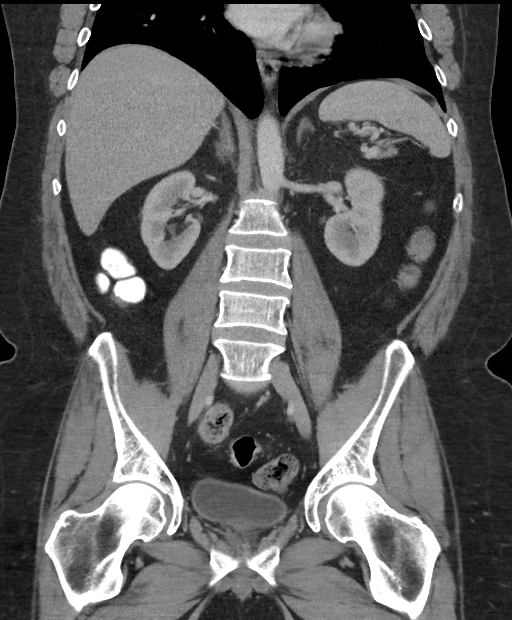

[16 of 46 positions shown; findings below may reference images not displayed]

FINDINGS: Lower chest: No acute abnormality.

Hepatobiliary: At least half a dozen circumscribed hypodensities are
noted within the liver, the largest approximately 13 mm in the right
hepatic lobe, some more apparent on repeat delayed images. These
likely reflect tiny cysts or hemangiomas. No biliary dilatation. The
gallbladder is unremarkable.

Pancreas: Normal

Spleen: Normal

Adrenals/Urinary Tract: Normal bilateral adrenal glands. Interpolar
exophytic 3.7 x 4.1 x 3.1 cm left renal cyst. Nonobstructing left
upper pole 5 mm calculus. No obstructive uropathy. Unremarkable
urinary bladder without focal mural thickening or calculus.

Stomach/Bowel: Decompressed stomach with normal small bowel
rotation. No bowel obstruction or acute inflammation.
Normal-appearing appendix. Moderate fecal residue within the
descending and sigmoid colon.

Vascular/Lymphatic: No significant vascular findings are present. No
enlarged abdominal or pelvic lymph nodes.

Reproductive: Prostate is unremarkable.

Other: No abdominal wall hernia or abnormality. No abdominopelvic
ascites.

Musculoskeletal: No acute or significant osseous findings.
IMPRESSION: 1. Hepatic and left renal hypodensities statistically consistent
with cysts.
2. No acute bowel inflammation or obstruction.
3. 5 mm calculus in the upper pole of the left kidney.

## 2018-12-28 ENCOUNTER — Other Ambulatory Visit: Payer: Self-pay | Admitting: Neurology

## 2018-12-28 NOTE — Telephone Encounter (Signed)
Gabapentin refilled x 6 months per last refill.

## 2019-01-12 ENCOUNTER — Other Ambulatory Visit: Payer: Self-pay | Admitting: Neurology

## 2019-01-23 ENCOUNTER — Telehealth: Payer: Self-pay | Admitting: *Deleted

## 2019-01-23 NOTE — Telephone Encounter (Addendum)
Spoke with intrafusion. He is scheduled next for Ocrevus infusion on 02/07/2019.  He was last seen 07/2018 and needs a f/u.   I called pt to schedule f/u with Dr. Epimenio Foot asap. He consented to virtual visit with Dr. Epimenio Foot on 01/26/19 at 9am. He will android phone for virtual visit. Email: [email or web address]. I scheduled/emailed pt.   I updated med list/pharmacy/allergy list. Pt stopped emgality. States it was ineffective.

## 2019-01-26 ENCOUNTER — Encounter: Payer: Self-pay | Admitting: Neurology

## 2019-01-26 ENCOUNTER — Ambulatory Visit (INDEPENDENT_AMBULATORY_CARE_PROVIDER_SITE_OTHER): Payer: Medicare HMO | Admitting: Neurology

## 2019-01-26 ENCOUNTER — Other Ambulatory Visit: Payer: Self-pay

## 2019-01-26 ENCOUNTER — Telehealth: Payer: Self-pay | Admitting: Neurology

## 2019-01-26 DIAGNOSIS — G35 Multiple sclerosis: Secondary | ICD-10-CM | POA: Diagnosis not present

## 2019-01-26 DIAGNOSIS — R269 Unspecified abnormalities of gait and mobility: Secondary | ICD-10-CM

## 2019-01-26 DIAGNOSIS — G4733 Obstructive sleep apnea (adult) (pediatric): Secondary | ICD-10-CM

## 2019-01-26 DIAGNOSIS — G4700 Insomnia, unspecified: Secondary | ICD-10-CM

## 2019-01-26 DIAGNOSIS — R5383 Other fatigue: Secondary | ICD-10-CM

## 2019-01-26 MED ORDER — DIAZEPAM 5 MG PO TABS: ORAL_TABLET | ORAL | 5 refills | Status: DC

## 2019-01-26 NOTE — Progress Notes (Signed)
[facility]  PATIENT: Parker [name] DOB: 01-05-1966  REFERRING DOCTOR OR PCP:  Dr. Cristopher Peru (fax  [phone]) SOURCE: patient, notes, MRI reports and MRI images on PACS  _________________________________   HISTORICAL  CHIEF COMPLAINT:  Chief Complaint  Patient presents with   Multiple Sclerosis    On Ocrevus    HISTORY OF PRESENT ILLNESS:  Parker [name] is a 53 y.o. man with multiple sclerosis and chronic migraine   Update 01/26/2019: Virtual Visit via Video Note I connected with Parker [name] on 01/26/19 at  9:00 AM EDT by a video enabled telemedicine application and verified that I am speaking with the correct person.  I discussed the limitations of evaluation and management by telemedicine and the availability of in person appointments. The patient expressed understanding and agreed to proceed.  History of Present Illness: He is on Ocrevus.  He tolerates it well and is due for his next infusion 02/07/2019.   He has not had any exacerbations while on that medication.   Over the last year, he has more muscle cramps and spasticity.   Sometimes he gets severe muscle spasms that straightens the leg.  He is on baclofen which helps.   During the day, he gets cramps.   He is walking reasonably well and does not need an aide.  There is some right leg weakness and spasticity..   He has done PT recently and feels it helps.   His right leg is mildly clumsy and weak.      He notes some memory issues and pain.  He is sleeping poorly, both sleep onset and maintenance.   Some nights he gets only 2 hours.  He notes a lot of discomfort in his muscles and backs making it hard to get comfortable.  He also has mild OSA on his recent home sleep study performed October 2019.  He has some sleepiness.  He had not been able to tolerate CPAP in the past and we discussed weight loss or oral appliance.  He he stopped modafinil as it did not help.    Observations/Objective: He is a  well-developed well-nourished woman in no acute distress.  The head is normocephalic and atraumatic.  Sclera are anicteric.  Visible skin appears normal.  The neck has a good range of motion.  Pharynx and tongue have normal appearance.  He is alert and fully oriented with fluent speech and good attention, knowledge and memory.  Extraocular muscles are intact.  Facial strength is normal.  Palatal elevation and tongue protrusion are midline.  He appears to have normal strength in the arms.  Rapid alternating movements and finger-nose-finger are performed well.   Assessment and Plan: Multiple sclerosis (HCC)  OSA (obstructive sleep apnea)  Gait disturbance  Other fatigue  Insomnia, unspecified type  1.  Continue Ocrevus.  He will have his next infusion in May.  We did discuss that Ocrevus has been associated with a higher risk of respiratory infections and it is possible that the infection could be worse if contracted.  He is doing a good job with social distancing.  Both his wife and son are able to work from home so they have been able to significantly limit exposure 2.   He is having much more problems with sleep.  This appears to possibly be due to muscle spasms that are worse at night.  I will write him for Valium 5 mg nightly and an additional pill a day that he can take as needed  if his spasms are worse.  Hopefully the Valium will help both the spasms in his sleep.  If not better, consider tizanidine addition to the baclofen. 3.   Stay active and exercise as tolerated. 4.   We discussed his mild sleep apnea on recent PSG.  Since he does have some daytime sleepiness, CPAP could be an option but he had difficulty using it in the past.  Therefore, weight loss or an oral appliance would be better options.  I gave him the name of [email or web address].com if he wants to try an inexpensive oral appliance. 5.   Return in 6 months or sooner if there are new or worsening neurologic symptoms.   Follow Up  Instructions: I discussed the assessment and treatment plan with the patient. The patient was provided an opportunity to ask questions and all were answered. The patient agreed with the plan and demonstrated an understanding of the instructions.    The patient was advised to call back or seek an in-person evaluation if the symptoms worsen or if the condition fails to improve as anticipated.  I provided 27 minutes of non-face-to-face time during this encounter.  Update 08/03/2018: His carpal tunnel syndrome has been bothering him more.  He has numbness in the left hand that will sometimes awaken him at night.  He notes slightly reduced strength.  He also has pain at the right wrist with occasional numbness going into the hand, but less than on the left.  He had a diagnosis of obstructive sleep apnea in the past but was unable to tolerate CPAP (15 years ago).  We will check a home sleep study and set him up for CPAP based on the results.  He feels his MS has been stable and he has no new symptoms.  He is on ocrelizumab.  Update 07/05/2018: He feels mostly stable.    He is on ocrelizumab.   He tolerates it well.   He has no definite exacerbation but notes some worsening symptoms  He is having more trouble with fatigue.    He notes this is physical > cognitive.    He is sleeping 4-6 hours sleep most nights.   He has more trouble falling asleep than falling asleep.    He sleeps worse when his back is hurting more.    He gets up earlier than he used to.   He feels mildly depresses, helped by duloxetine.      He notes some leg weakness, right worse than left, and he sometimes uses a cane if more tired.    He did PT with benefit in the past.   He stumbles with rare falls.   He notes some right leg spasticity.  He gets cramps at times, helped by tumeric. [name]  He has numbness and dysesthesias.     Bladder is doing better with tamsulosin.    Vision is stable.    He has vertigo and sometimes notes nausea.     He  has chronic migraines occurring daily for > 4 hours a day.    Due to high co-pay, he never did Botox.    He has left CTS and the injection helped x 4 months with pain just starting to come back.   He snores and has EDS,    He was diagnosed with OSA in the past (15 years ago) but could not tolerate the mask (was a FFM).       Update 01/05/2018: He feels his MS is stable.  His next infusion should be at the end of the month.      He has aches and pains in the neck and back ans sometimes the chest and legs.   These fluctuate but not necessarily associated with activity.     The lower back pain on the right is the worse.  He notes it more at night in bed.  Thgere is radiation into the buttock.   He takes baclofen 20 mg po tid, gabapentin 600 mg po tid.[name]   He stays active but does not exercise.     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   The splint seemed to aggravate the pain so he no longer uses.      He has daily headaches with migrainous features.   He took Ajovy samples but there was no benefit.   He used to be on Botox for migraine and had a good benefit .   He also has been on multiple other prophylactic agents without benefit including antiepileptic agents, muscle relaxants, NSAIDs and beta-blockers.  EPWORTH SLEEPINESS SCALE  On a scale of 0 - 3 what is the chance of dozing:  Sitting and Reading:   3 Watching TV:    3 Sitting inactive in a public place: 3 Passenger in car for one hour: 2 Lying down to rest in the afternoon: 3 Sitting and talking to someone: 0 Sitting quietly after lunch:  3 In a car, stopped in traffic:  0  Total (out of 24):   17/24   Moderate EDS   Update 07/07/2017:    Earlier this year, he switched from [place] to ocrelizumab due to [place] being removed from the market because of multiple cases of encephalitis. He follow through with the liver testing for the next 6 months and liver tests  were fine. He tolerated the ocrelizumab infusion well but had a pneumonia and cough afterwards.   He sees ENT about this.   He has not had any definite exacerbation though he feels his balance is doing worse.   He also notes more numbness in his arms that is painful.     The numbness sometimes awakens him at night.    This occurs left > right.    He feels weak when the tingling is more severe.      Flomax helps the bladder hesitancy some but not always.    Fatigue fluctuates and is worse with temperature extremes.    He sleeps poorly between the headaches and numbness.     He notes mild depression.     Migraines are still occurring on a daily basis (30/30 days a month for greater than 4 hours a day).  Unfortunately, his co-pay for Botox is very high through his insurance.    We discussed Ajovy, one of the anti-CGRP compounds.  The migraines are associated with throbbing pain that occurs on the right side more than the left. He has not received benefit from a generative medications though he did receive a benefit from Botox in the past. Triptan's did not help to reduce headache pain when present.   ___________________________ From 12/09/2016:  MS:   He has been on [place] since late 2016 and he tolerates it well.  It was just taken off the market due to encephalitis cases reported.    LFT's have been normal.  He denies any skin symptoms, abdominal symptoms or significant neurologic changes.  His MS appears to  be stable. He did not have any exacerbations while on [name]. However, he did not do as well and he was on Betaseron or Gilenya I have reviewed the MRI of the brain dated 05/27/2016 and compared it with the 05/02/2015 MRI. There are no changes in the MRI over the interval and changes are consistent with multiple sclerosis. No acute findings. 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  His headache pain is 8/10.   Pain is throbbing and more often on the right side of his head. When pain is more severe he will get nausea, photophobia and phonophobia. In the past, Botox was very effective in controlling these headaches.   He has not received much benefit as much from other preventative medicines (see below).   Imitrex and other triptans have not helped reduce headaches.     Chronic migraine prophylactic medications tried and failed:   He has tried and failed antiepileptic (Topiramate, Depakote IV, Keppra), NSAIDs (various), opiates (various), Tricyclics (amitriptyline, nortriptyline), muscle relaxants (tizanidine, baclofen).  Triptans (several different) have not helped relieve the headaches.    Gait/strength/sensation:    He stumbles and has had rare falls. He feels his gait is off more because of balance then due to strength.. He reports numbness in the left > righ side of his body with painful dysesthesias, helped by Lyrica. The dysesthesias are worse in cold weather.  He stopped Ampyra as he is was not getting a benefit in walking.   Legs feel tight at times.    Bladder/bowel: He reports urinary hesitancy is better since starting tamsulosin and he has less nocturia.    He has had constipation and often uses enema.    We discussed using MiraLAX.  Vision: He reports some intermittent blurry vision. He wears glasses.  No diplopia.  Fatigue/Sleep:  He reports physical and cognitive fatigue, worse as the day goes on.   Heat has not greatly affected the fatigue.  Amantadine has not helped much. He was once tried on Ritalin but it did not help his focus and he became irritable so he stopped.   . He has insomnia, sleep maintenance most nights and sleep onset issues some nights..   Mood: He reports depression and anxiety and has had panic attacks. The panic attacks can be worse at night.   He does not think increasing the duloxetine has made much  difference.  Cognition:   He reports difficulties with cognition. Specifically he has noted difficulty with short-term memory, reduce focus and attention, verbal fluency and executive function. .  MS History:   He presented in 2003 with numbness that started in the left foot and then progress to the entire left side over a period of about a month. He was initially worked up but there was not enough evidence to conclusively call him MS. He was referred to [facility] clinic and saw Dr. Caryn Section. 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 2008, he went on Tysabri and stayed on Tysabri for about 4 or 5 years. He tolerated it very well and felt that he was stable while he was on it. 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 He switched to Gilenya. He more recently moved to  this area. Dr. Sherryll Burger retested him and he is JCV Ab high posititve and placed him on [name].     Data personally reviewed the MRI of the brain dated 05/02/2015. It shows multiple white matter lesions, many of them in the periventricular white matter, some radially oriented to the ventricles. There is a focus within the right cerebellar hemisphere, as well. There are no enhancing lesions. The MRI of the cervical spine from the same day shows a normal spinal cord. At C6-C7 there is L > R foraminal narrowing due to degenerative changes  with some encroachment on the left C7 nerve root.  REVIEW OF SYSTEMS: Constitutional: No fevers, chills, sweats, or change in appetite.   He reports fatigue and poor sleep. Eyes: No visual changes, double vision, eye pain Ear, nose and throat: No hearing loss, ear pain, nasal congestion, sore throat Cardiovascular: No chest pain, palpitations Respiratory: No shortness of breath at rest or with exertion.   No wheezes GastrointestinaI:  No nausea, vomiting, diarrhea, abdominal pain, fecal incontinence Genitourinary: as above. Musculoskeletal.    he reports pain in the neck, back and many of his muscles. Integumentary: No rash, pruritus, skin lesions Neurological: as above Psychiatric: Notes depression and anxiety Endocrine: No palpitations, diaphoresis, change in appetite, change in weigh or increased thirst Hematologic/Lymphatic: No anemia, purpura, petechiae. Allergic/Immunologic: No itchy/runny eyes, nasal congestion, recent allergic reactions, rashes  ALLERGIES: Allergies  Allergen Reactions   Glucosamine Forte [Nutritional Supplements]    Sulfa Antibiotics    Sulfur Other (See Comments)    Severe Headaches   Tapentadol Itching   Clonazepam Rash   Imipramine Rash   Penicillins Rash    HOME MEDICATIONS:  Current Outpatient Medications:    amantadine (SYMMETREL) 100 MG capsule, TAKE 1 CAPSULE BY MOUTH TWICE DAILY, Disp: 180 capsule, Rfl: 1   baclofen (LIORESAL) 20 MG tablet, TAKE 1 TABLET BY MOUTH THREE TIMES A DAY, Disp: 90 tablet, Rfl: 11   DULoxetine (CYMBALTA) 60 MG capsule, TAKE 1 CAPSULE BY MOUTH EVERY DAY, Disp: 30 capsule, Rfl: 11   gabapentin (NEURONTIN) 600 MG tablet, TAKE 1 TABLET BY MOUTH THREE TIMES DAILY, Disp: 270 tablet, Rfl: 1   HYDROcodone-acetaminophen (NORCO/VICODIN) 5-325 MG tablet, Take 1 tablet by mouth every 6 (six) hours as needed for moderate pain., Disp: 15 tablet, Rfl: 0   modafinil (PROVIGIL) 200 MG tablet, TAKE 1 TABLET BY MOUTH ONCE DAILY (Patient not taking: Reported on 01/23/2019), Disp: 90 tablet, Rfl: 0   ocrelizumab 600 mg in sodium chloride 0.9 % 500 mL, Inject 600 mg into the vein every 6 (six) months., Disp: , Rfl:    tamsulosin (FLOMAX) 0.4 MG CAPS capsule, TAKE 1 CAPSULE BY MOUTH EVERY DAY, Disp: 90 capsule, Rfl: 3 No current facility-administered medications for this visit.   Facility-Administered Medications Ordered in Other Visits:    gadopentetate  dimeglumine (MAGNEVIST) injection 20 mL, 20 mL, Intravenous, Once PRN, Mandolin Falwell, Pearletha Furl, MD  PAST MEDICAL HISTORY: Past Medical History:  Diagnosis Date   Depression    Headache    Hearing loss    Memory loss    Multiple sclerosis (HCC)    Vision abnormalities     PAST SURGICAL HISTORY: Past Surgical History:  Procedure Laterality Date   COLONOSCOPY WITH PROPOFOL N/A 07/01/2015   Procedure: COLONOSCOPY WITH PROPOFOL;  Surgeon: Christena Deem, MD;  Location: [facility] ENDOSCOPY;  Service: Endoscopy;  Laterality: N/A;   varicose seals      FAMILY HISTORY: Family History  Problem Relation  Age of Onset   Lung cancer Mother    Heart disease Father    Prostate cancer Father    Diabetes type II Father     SOCIAL HISTORY:  Social History   Socioeconomic History   Marital status: Married    Spouse name: Not on file   Number of children: Not on file   Years of education: Not on file   Highest education level: Not on file  Occupational History   Not on file  Social Needs   Financial resource strain: Not on file   Food insecurity:    Worry: Not on file    Inability: Not on file   Transportation needs:    Medical: Not on file    Non-medical: Not on file  Tobacco Use   Smoking status: Never Smoker   Smokeless tobacco: Never Used  Substance and Sexual Activity   Alcohol use: No   Drug use: No   Sexual activity: Not on file  Lifestyle   Physical activity:    Days per week: Not on file    Minutes per session: Not on file   Stress: Not on file  Relationships   Social connections:    Talks on phone: Not on file    Gets together: Not on file    Attends religious service: Not on file    Active member of club or organization: Not on file    Attends meetings of clubs or organizations: Not on file    Relationship status: Not on file   Intimate partner violence:    Fear of current or ex partner: Not on file    Emotionally abused: Not on file     Physically abused: Not on file    Forced sexual activity: Not on file  Other Topics Concern   Not on file  Social History Narrative   Not on file     PHYSICAL EXAM  There were no vitals filed for this visit.  There is no height or weight on file to calculate BMI.   General:   Patient is well-developed and well-nourished and in no acute distress   Extremities: He has mild tenderness to deep palpation at the Right wrist and has a Tinel sign at the left wrist.  Neurologic Exam  Mental status: The patient is alert and oriented . The patient has apparent normal recent and remote memory, with mildly reduced attention span and concentration ability.   Speech is normal.  Cranial nerves: Extraocular muscles are normal.     Motor:  Muscle bulk is normal.   Tone is increased in legs. Strength is  5 / 5 in arms and proximally in legs. Strength was 4+/5 distally in the legs and 4/5 in the APB muscle on the left   Sensory:   On sensory testing, there is numbness in the left hand over the thenar eminence  Coordination: Finger-nose-finger is performed well.  Gait and station: Station is normal.  The gait is wide and spastic.  The tandem gait is poor.     DIAGNOSTIC DATA (LABS, IMAGING, TESTING) - I reviewed patient records, labs, notes, testing and imaging myself where available.      ASSESSMENT AND PLAN  Multiple sclerosis (HCC)  OSA (obstructive sleep apnea)  Gait disturbance  Other fatigue  Insomnia, unspecified type    Killian Ress A. Epimenio FootSater, MD, PhD, FAAN Certified in Neurology, Clinical Neurophysiology, Sleep Medicine, Pain Medicine and Neuroimaging Director, [facility]  [facility] [address] [place], [place] [address] [phone]

## 2019-01-26 NOTE — Telephone Encounter (Signed)
01/26/19 - LVM to schedule 6 month f/u w/ Dr. Epimenio Foot

## 2019-01-26 NOTE — Telephone Encounter (Signed)
-----   Message from Asa Lente, MD sent at 01/26/2019  9:42 AM EDT ----- F/u in 6 mos

## 2019-02-08 ENCOUNTER — Other Ambulatory Visit: Payer: Self-pay | Admitting: Neurology

## 2019-03-12 ENCOUNTER — Other Ambulatory Visit: Payer: Self-pay | Admitting: Neurology

## 2019-04-22 ENCOUNTER — Other Ambulatory Visit: Payer: Self-pay | Admitting: Neurology

## 2019-04-29 ENCOUNTER — Other Ambulatory Visit: Payer: Self-pay | Admitting: Neurology

## 2019-05-01 NOTE — Telephone Encounter (Signed)
Called pt and scheduled 6 month f/u for 07/31/19 at 1100am with Dr. Felecia Shelling.

## 2019-05-04 IMAGING — DX DG HAND COMPLETE 3+V*L*
3 series · 3 of 3 positions shown · non-contrast
Comparison: None.

CLINICAL DATA: Left hand laceration.

EXAM:
LEFT HAND - COMPLETE 3+ VIEW

[hand ap]
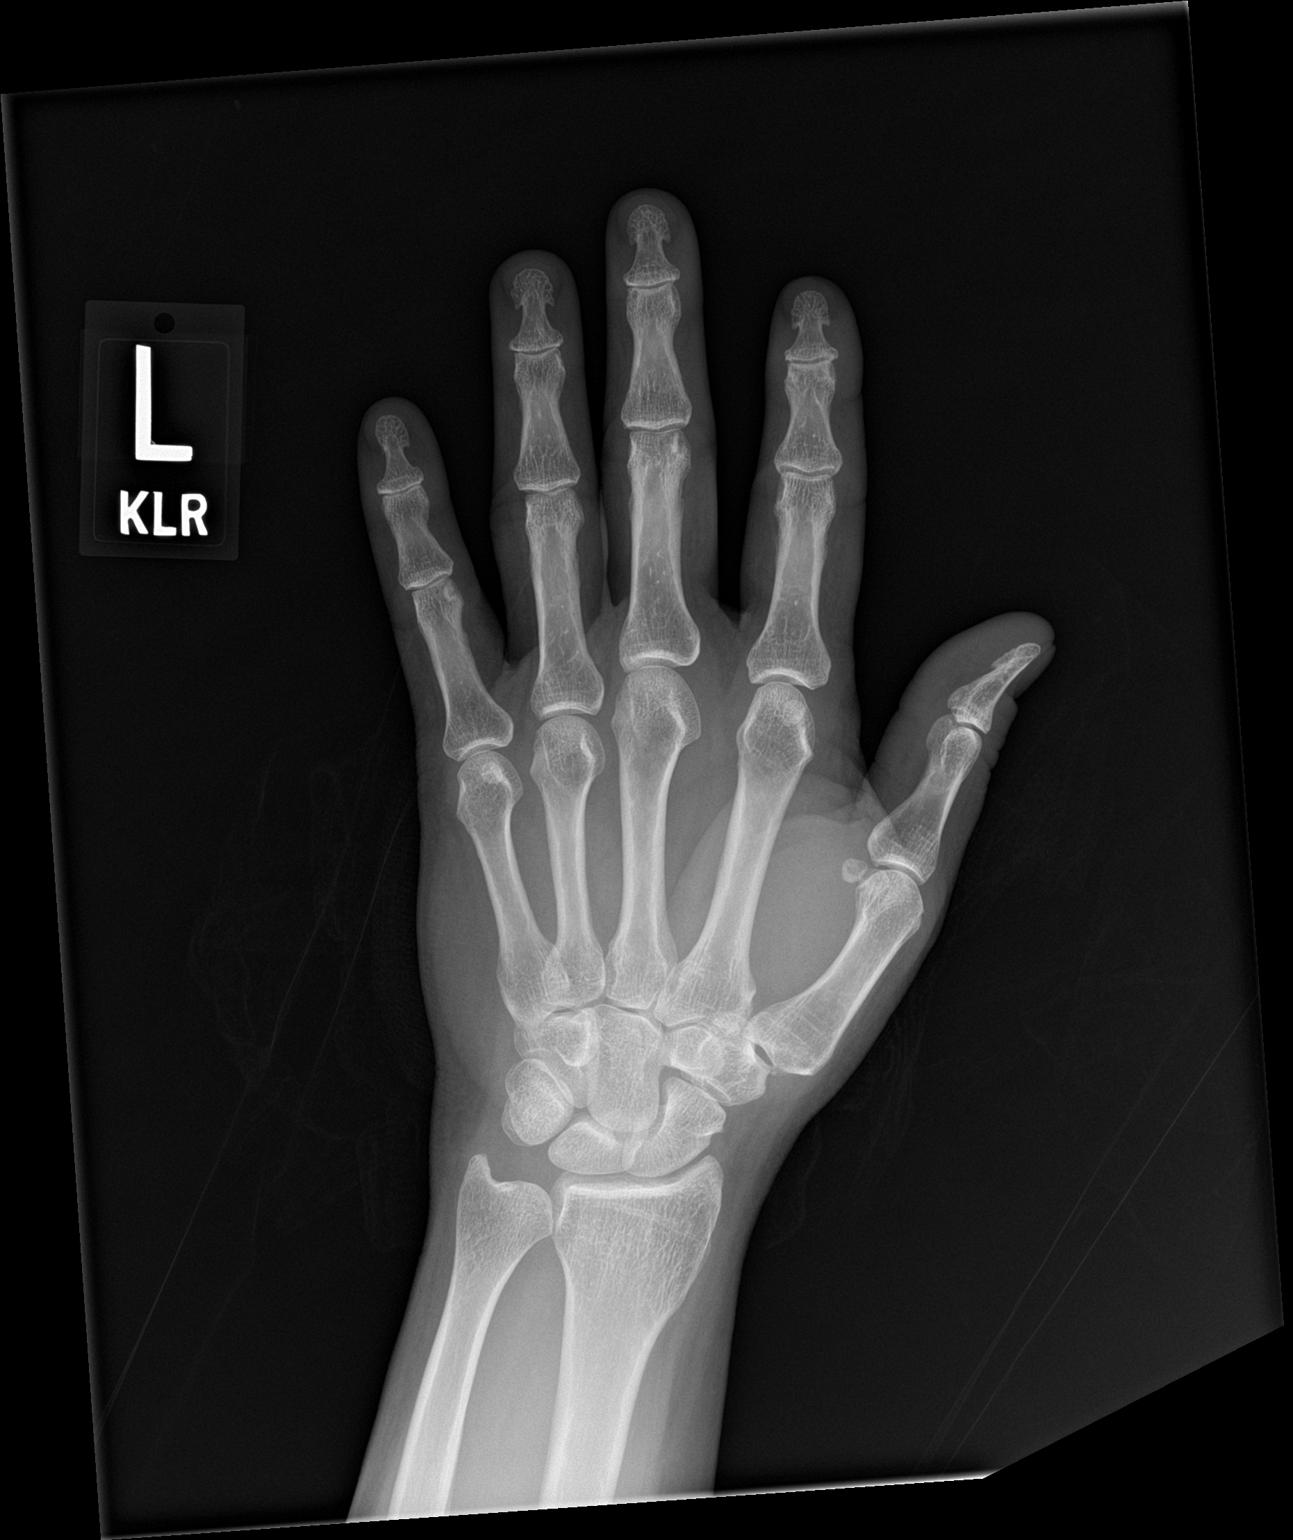

[hand obl]
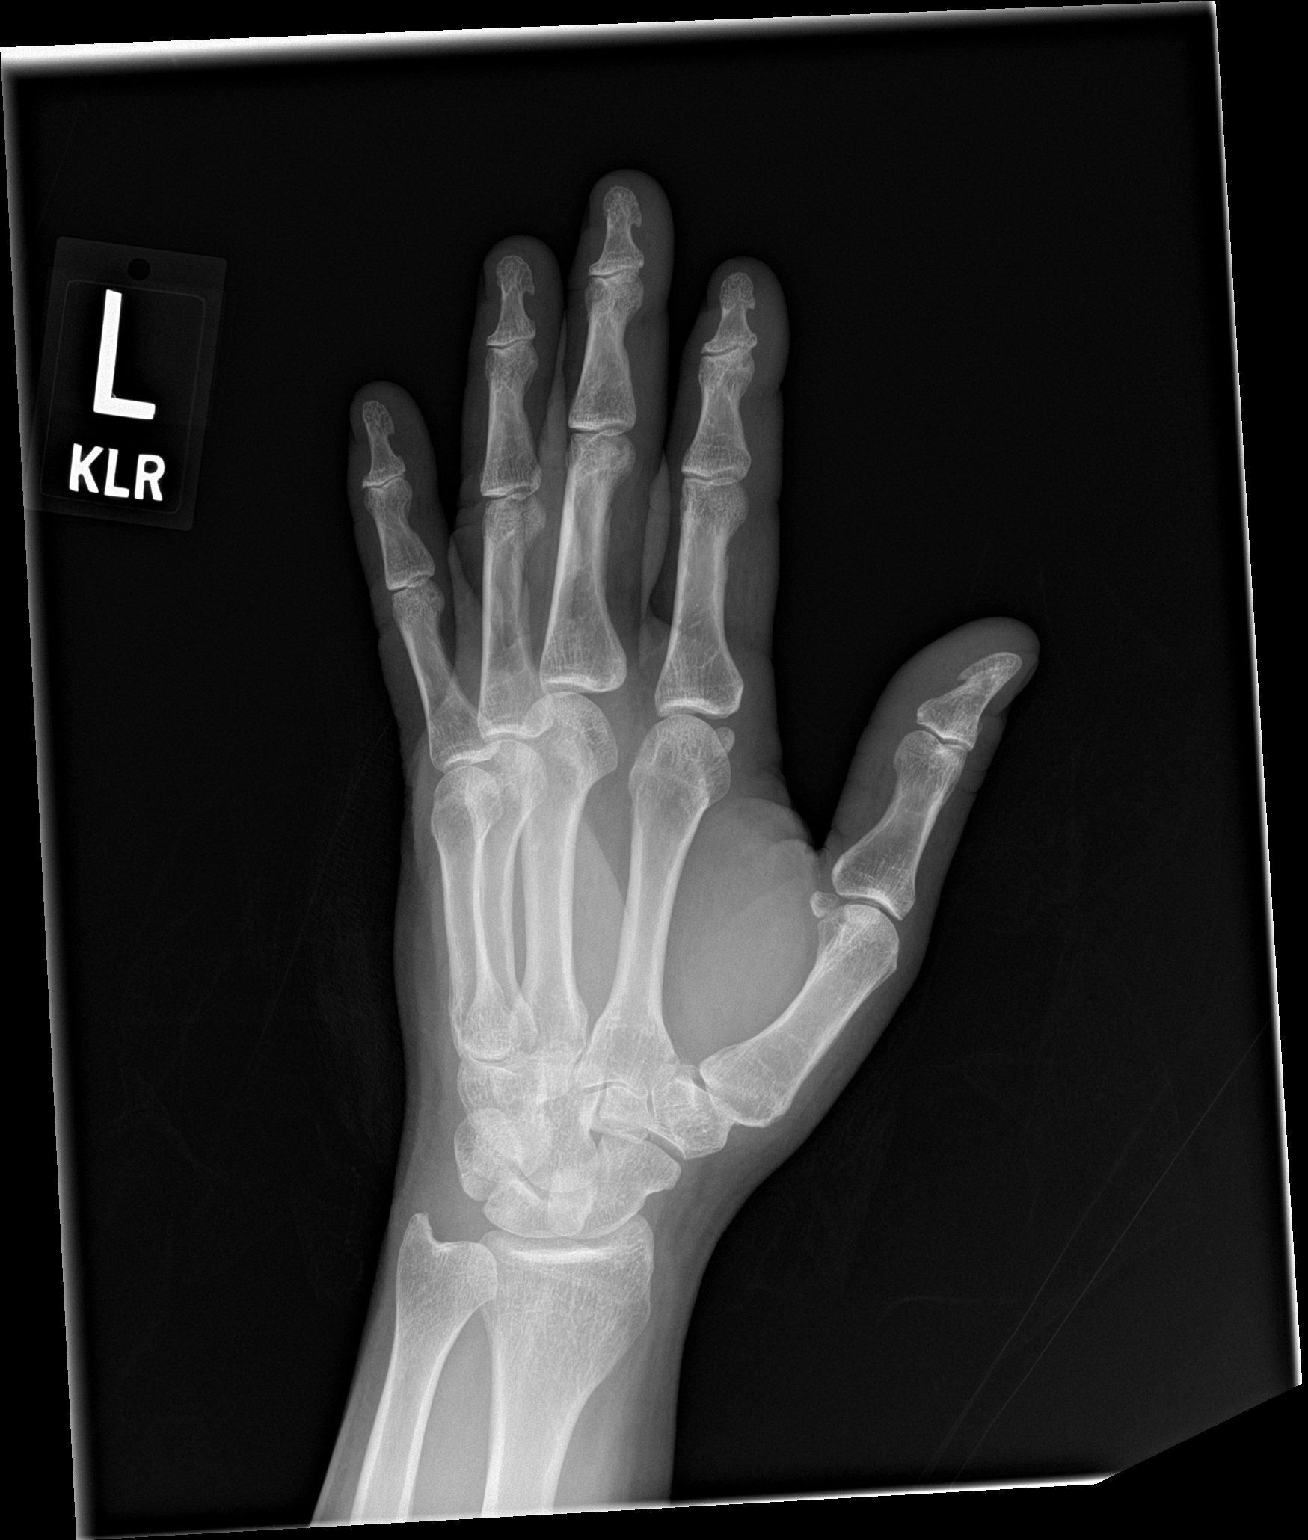

[hand lat]
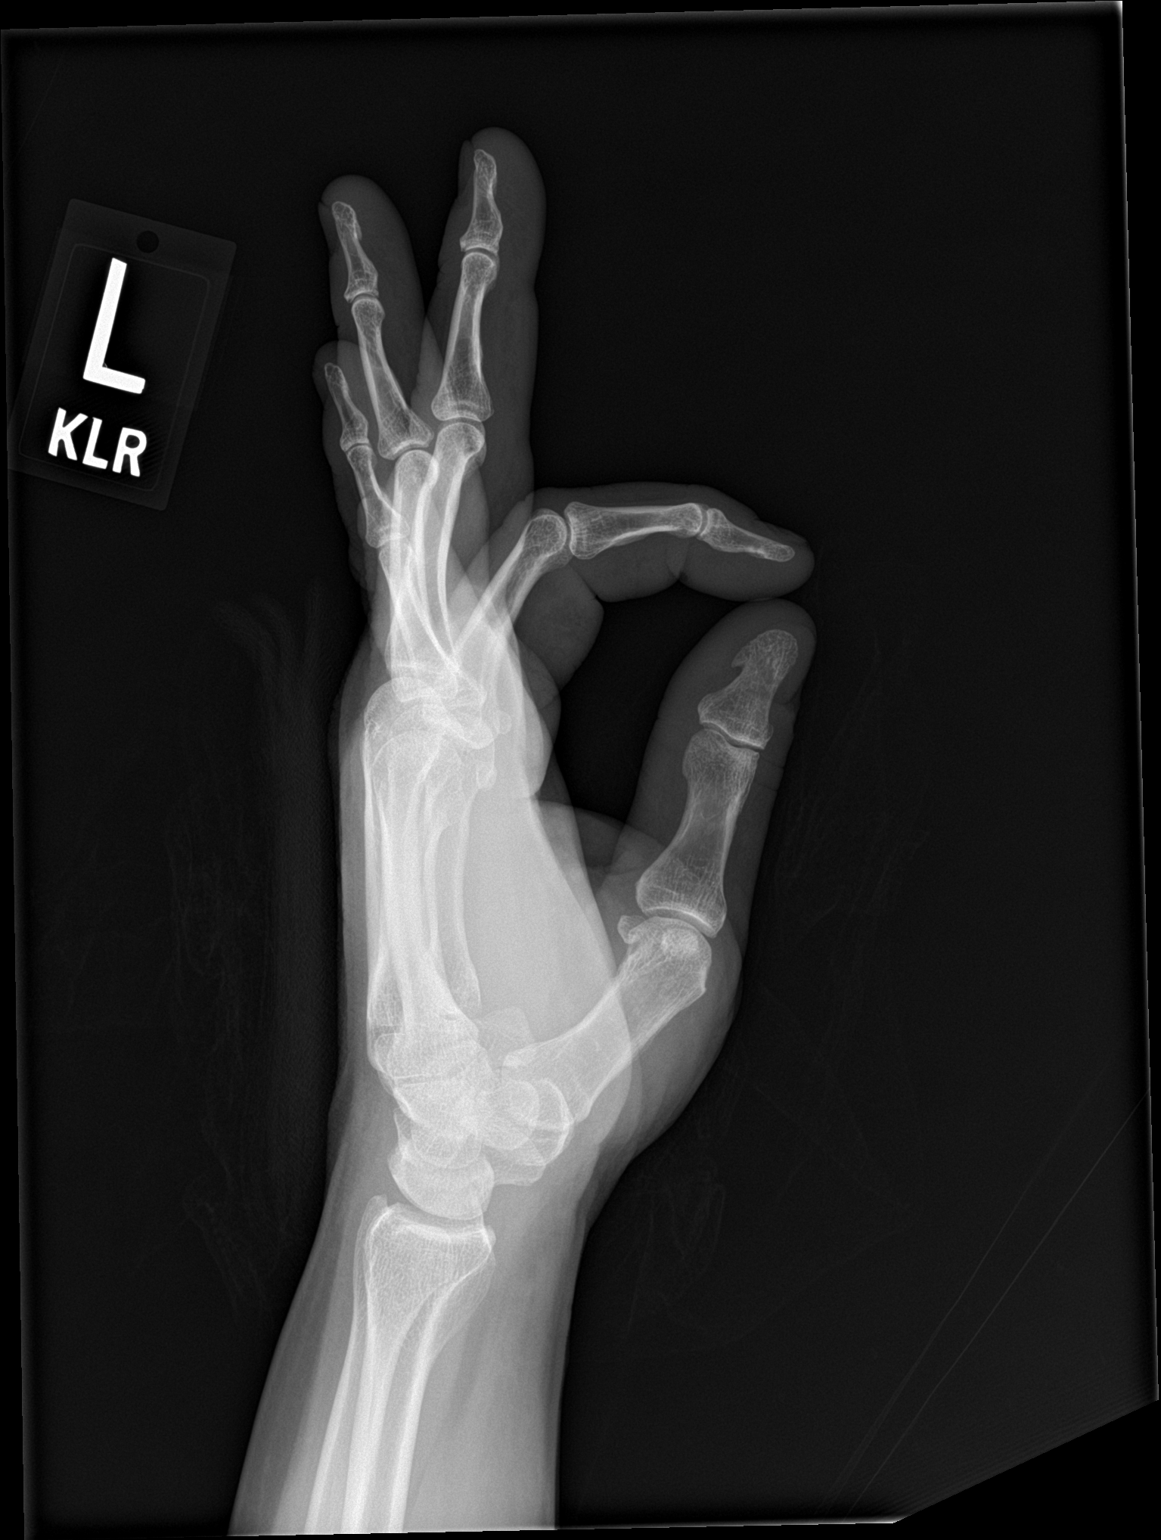

[3 of 3 positions shown; findings below may reference images not displayed]

FINDINGS: There is no evidence of fracture or dislocation. There is no
evidence of arthropathy or other focal bone abnormality. Soft
tissues are unremarkable.
IMPRESSION: Normal left hand.  No radiopaque foreign body is noted.

## 2019-07-12 ENCOUNTER — Other Ambulatory Visit: Payer: Self-pay | Admitting: Neurology

## 2019-07-31 ENCOUNTER — Encounter: Payer: Self-pay | Admitting: Neurology

## 2019-07-31 ENCOUNTER — Other Ambulatory Visit: Payer: Self-pay

## 2019-07-31 ENCOUNTER — Ambulatory Visit: Payer: Medicare HMO | Admitting: Neurology

## 2019-07-31 VITALS — BP 121/70 | HR 79 | Temp 98.3°F | Ht 71.0 in | Wt 267.8 lb

## 2019-07-31 DIAGNOSIS — R269 Unspecified abnormalities of gait and mobility: Secondary | ICD-10-CM | POA: Diagnosis not present

## 2019-07-31 DIAGNOSIS — N3281 Overactive bladder: Secondary | ICD-10-CM

## 2019-07-31 DIAGNOSIS — R5383 Other fatigue: Secondary | ICD-10-CM

## 2019-07-31 DIAGNOSIS — G35 Multiple sclerosis: Secondary | ICD-10-CM

## 2019-07-31 DIAGNOSIS — Z79899 Other long term (current) drug therapy: Secondary | ICD-10-CM

## 2019-07-31 DIAGNOSIS — G5602 Carpal tunnel syndrome, left upper limb: Secondary | ICD-10-CM

## 2019-07-31 DIAGNOSIS — G35D Multiple sclerosis, unspecified: Secondary | ICD-10-CM

## 2019-07-31 MED ORDER — AMANTADINE HCL 100 MG PO CAPS: 100.0000 mg | ORAL_CAPSULE | Freq: Two times a day (BID) | ORAL | 1 refills | Status: DC

## 2019-07-31 MED ORDER — DIAZEPAM 5 MG PO TABS: ORAL_TABLET | ORAL | 5 refills | Status: AC

## 2019-07-31 NOTE — Patient Instructions (Signed)
Try Lipoflavinoid for tinnitus  In [place], consider Dr. Magda Bernheim or [facility]

## 2019-07-31 NOTE — Progress Notes (Signed)
[facility]  PATIENT: Parker RuthsRobert [name] DOB: [birth year]  REFERRING DOCTOR OR PCP:  Dr. Cristopher PeruHemang Shah (fax  [phone]) SOURCE: patient, notes, MRI reports and MRI images on PACS  _________________________________   HISTORICAL  CHIEF COMPLAINT:  Chief Complaint  Patient presents with   Follow-up    RM 12, alone. Last seen 01/26/2019. They are in process of selling home [place].    Multiple Sclerosis    On Ocrevus. Last infusion:02/07/2019.  Checked with infusion suite on next infusion: 08/02/19 at 9am. Provided pt reminder appt card.   Insomnia    Takes valium    HISTORY OF PRESENT ILLNESS:  Parker RuthsRobert [name] is a 53 y.o. man with multiple sclerosis.   Update 07/31/2019: He feels he is mostly stable on Ocrevus but has noted balance is worse and he has a few falls.  Falls are due to poor balance and he sometimes notes spinning sensation before a fall.   He also has more pain, mostly back, leg, arms and hands (has CTS).     He notes mild leg weakness and has muscle spasms.  He is on baclofen and takes valium at night.   Valium was helping a lot at first, less so more recently.   He has no hangover in the morning.  He has urinary dysfunction, helped by tamsulosin.  He notes some tinnitus.  [place] [facility] and we discussed a couple doctors in that area.     Update 01/26/2019 (virtual) He is on Ocrevus.  He tolerates it well and is due for his next infusion 02/07/2019.   He has not had any exacerbations while on that medication.   Over the last year, he has more muscle cramps and spasticity.   Sometimes he gets severe muscle spasms that straightens the leg.  He is on baclofen which helps.   During the day, he gets cramps.   He is walking reasonably well and does not need an aide.  There is some right leg weakness and spasticity..   He has done PT recently and feels it helps.   His right leg is mildly clumsy  and weak.      He notes some memory issues and pain.  He is sleeping poorly, both sleep onset and maintenance.   Some nights he gets only 2 hours.  He notes a lot of discomfort in his muscles and backs making it hard to get comfortable.  He also has mild OSA on his recent home sleep study performed October 2019.  He has some sleepiness.  He had not been able to tolerate CPAP in the past and we discussed weight loss or oral appliance.  He he stopped modafinil as it did not help.    Update 08/03/2018: His carpal tunnel syndrome has been bothering him more.  He has numbness in the left hand that will sometimes awaken him at night.  He notes slightly reduced strength.  He also has pain at the right wrist with occasional numbness going into the hand, but less than on the left.  He had a diagnosis of obstructive sleep apnea in the past but was unable to tolerate CPAP (15 years ago).  We will check a home sleep study and set him up for CPAP based on the results.  He feels his MS has been stable and he has no new symptoms.  He is on ocrelizumab.  Update 07/05/2018:  He feels mostly stable.    He is on ocrelizumab.   He tolerates it well.   He has no definite exacerbation but notes some worsening symptoms  He is having more trouble with fatigue.    He notes this is physical > cognitive.    He is sleeping 4-6 hours sleep most nights.   He has more trouble falling asleep than falling asleep.    He sleeps worse when his back is hurting more.    He gets up earlier than he used to.   He feels mildly depresses, helped by duloxetine.      He notes some leg weakness, right worse than left, and he sometimes uses a cane if more tired.    He did PT with benefit in the past.   He stumbles with rare falls.   He notes some right leg spasticity.  He gets cramps at times, helped by tumeric. [name]  He has numbness and dysesthesias.     Bladder is doing better with tamsulosin.    Vision is stable.    He has vertigo and sometimes  notes nausea.     He has chronic migraines occurring daily for > 4 hours a day.    Due to high co-pay, he never did Botox.    He has left CTS and the injection helped x 4 months with pain just starting to come back.   He snores and has EDS,    He was diagnosed with OSA in the past (15 years ago) but could not tolerate the mask (was a FFM).       Update 01/05/2018: He feels his MS is stable.   His next infusion should be at the end of the month.      He has aches and pains in the neck and back ans sometimes the chest and legs.   These fluctuate but not necessarily associated with activity.     The lower back pain on the right is the worse.  He notes it more at night in bed.  Thgere is radiation into the buttock.   He takes baclofen 20 mg po tid, gabapentin 600 mg po tid.[name]   He stays active but does not exercise.     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   The splint seemed to aggravate the pain so he no longer uses.      He has daily headaches with migrainous features.   He took Ajovy samples but there was no benefit.   He used to be on Botox for migraine and had a good benefit .   He also has been on multiple other prophylactic agents without benefit including antiepileptic agents, muscle relaxants, NSAIDs and beta-blockers.  EPWORTH SLEEPINESS SCALE  On a scale of 0 - 3 what is the chance of dozing:  Sitting and Reading:   3 Watching TV:    3 Sitting inactive in a public place: 3 Passenger in car for one hour: 2 Lying down to rest in the afternoon: 3 Sitting and talking to someone: 0 Sitting quietly after lunch:  3 In a car, stopped in traffic:  0  Total (out of 24):   17/24   Moderate EDS   Update 07/07/2017:    Earlier this year, he switched from [place] to ocrelizumab due to [place] being removed from the market because of multiple cases of encephalitis. He follow through with the liver testing for the  next 6  months and liver tests were fine. He tolerated the ocrelizumab infusion well but had a pneumonia and cough afterwards.   He sees ENT about this.   He has not had any definite exacerbation though he feels his balance is doing worse.   He also notes more numbness in his arms that is painful.     The numbness sometimes awakens him at night.    This occurs left > right.    He feels weak when the tingling is more severe.      Flomax helps the bladder hesitancy some but not always.    Fatigue fluctuates and is worse with temperature extremes.    He sleeps poorly between the headaches and numbness.     He notes mild depression.     Migraines are still occurring on a daily basis (30/30 days a month for greater than 4 hours a day).  Unfortunately, his co-pay for Botox is very high through his insurance.    We discussed Ajovy, one of the anti-CGRP compounds.  The migraines are associated with throbbing pain that occurs on the right side more than the left. He has not received benefit from a generative medications though he did receive a benefit from Botox in the past. Triptan's did not help to reduce headache pain when present.   ___________________________ From 12/09/2016:  MS:   He has been on [place] since late 2016 and he tolerates it well.  It was just taken off the market due to encephalitis cases reported.    LFT's have been normal.  He denies any skin symptoms, abdominal symptoms or significant neurologic changes.  His MS appears to be stable. He did not have any exacerbations while on [name]. However, he did not do as well and he was on Betaseron or Gilenya I have reviewed the MRI of the brain dated 05/27/2016 and compared it with the 05/02/2015 MRI. There are no changes in the MRI over the interval and changes are consistent with multiple sclerosis. No acute findings. 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  His headache pain is 8/10.   Pain is throbbing and more often on the right side of his head. When pain is more severe he will get nausea, photophobia and phonophobia. In the past, Botox was very effective in controlling these headaches.   He has not received much benefit as much from other preventative medicines (see below).   Imitrex and other triptans have not helped reduce headaches.     Chronic migraine prophylactic medications tried and failed:   He has tried and failed antiepileptic (Topiramate, Depakote IV, Keppra), NSAIDs (various), opiates (various), Tricyclics (amitriptyline, nortriptyline), muscle relaxants (tizanidine, baclofen).  Triptans (several different) have not helped relieve the headaches.    Gait/strength/sensation:    He stumbles and has had rare falls. He feels his gait is off more because of balance then due to strength.. He reports numbness in the left > righ side of his body with painful dysesthesias, helped by Lyrica. The dysesthesias are worse in cold weather.  He stopped Ampyra as he is was not getting a benefit in walking.   Legs feel tight at times.    Bladder/bowel: He reports urinary hesitancy is better since starting tamsulosin and he has less nocturia.    He has had constipation and often uses enema.  We discussed using MiraLAX.  Vision: He reports some intermittent blurry vision. He wears glasses.  No diplopia.  Fatigue/Sleep:  He reports physical and cognitive fatigue, worse as the day goes on.   Heat has not greatly affected the fatigue.  Amantadine has not helped much. He was once tried on Ritalin but it did not help his focus and he became irritable so he stopped.   . He has insomnia, sleep maintenance most nights and sleep onset issues some nights..   Mood: He reports depression and anxiety and has had panic attacks. The panic attacks can be worse at night.   He does not think increasing the duloxetine has made much  difference.  Cognition:   He reports difficulties with cognition. Specifically he has noted difficulty with short-term memory, reduce focus and attention, verbal fluency and executive function. .  MS History:   He presented in 2003 with numbness that started in the left foot and then progress to the entire left side over a period of about a month. He was initially worked up but there was not enough evidence to conclusively call him MS. He was referred to [facility] and saw Dr. Caryn Section. 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 2008, he went on Tysabri and stayed on Tysabri for about 4 or 5 years. He tolerated it very well and felt that he was stable while he was on it. 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 He switched to Gilenya. He more recently moved to this area. Dr. Sherryll Burger retested him and he is JCV Ab high posititve and placed him on [name].     Data personally reviewed the MRI of the brain dated 05/02/2015. It shows multiple white matter lesions, many of them in the periventricular white matter, some radially oriented to the ventricles. There is a focus within the right cerebellar hemisphere, as well. There are no enhancing lesions. The MRI of the cervical spine from the same day shows a normal spinal cord. At C6-C7 there is L > R foraminal narrowing due to degenerative changes  with some encroachment on the left C7 nerve root.  REVIEW OF SYSTEMS: Constitutional: No fevers, chills, sweats, or change in appetite.   He reports fatigue and poor sleep. Eyes: No visual changes, double vision, eye pain Ear, nose and throat: No hearing loss, ear pain, nasal congestion, sore throat Cardiovascular: No chest pain, palpitations Respiratory: No shortness of breath at rest or with exertion.   No wheezes GastrointestinaI:  No nausea, vomiting, diarrhea, abdominal pain, fecal incontinence Genitourinary: as above. Musculoskeletal.    he reports pain in the neck, back and many of his muscles. Integumentary: No rash, pruritus, skin lesions Neurological: as above Psychiatric: Notes depression and anxiety Endocrine: No palpitations, diaphoresis, change in appetite, change in weigh or increased thirst Hematologic/Lymphatic: No anemia, purpura, petechiae. Allergic/Immunologic: No itchy/runny eyes, nasal congestion, recent allergic reactions, rashes  ALLERGIES: Allergies  Allergen Reactions   Glucosamine Forte [Nutritional Supplements]    Sulfa Antibiotics    Sulfur Other (See Comments)    Severe Headaches   Tapentadol Itching   Clonazepam Rash   Imipramine Rash   Penicillins Rash    HOME MEDICATIONS:  Current Outpatient Medications:    amantadine (SYMMETREL) 100 MG capsule, Take 1 capsule (100 mg total) by mouth 2 (two) times daily., Disp: 180  capsule, Rfl: 1   baclofen (LIORESAL) 20 MG tablet, TAKE 1 TABLET BY MOUTH THREE TIMES A DAY, Disp: 270 tablet, Rfl: 3   diazepam (VALIUM) 5 MG tablet, Take 1 or two po qHS, Disp: 60 tablet, Rfl: 5   DULoxetine (CYMBALTA) 60 MG capsule, Take 1 capsule by mouth once daily, Disp: 90 capsule, Rfl: 3   gabapentin (NEURONTIN) 600 MG tablet, TAKE 1 TABLET BY MOUTH THREE TIMES DAILY, Disp: 270 tablet, Rfl: 0   HYDROcodone-acetaminophen (NORCO/VICODIN) 5-325 MG tablet, Take 1 tablet by mouth every 6 (six) hours as needed for moderate pain., Disp: 15 tablet, Rfl: 0   ocrelizumab 600 mg in sodium chloride 0.9 % 500 mL, Inject 600 mg into the vein every 6 (six) months., Disp: , Rfl:    tamsulosin (FLOMAX) 0.4 MG CAPS capsule, TAKE 1 CAPSULE BY MOUTH EVERY DAY, Disp: 90 capsule, Rfl: 3 No current facility-administered medications for this visit.   Facility-Administered Medications Ordered in Other Visits:    gadopentetate dimeglumine (MAGNEVIST) injection 20  mL, 20 mL, Intravenous, Once PRN, Chistopher Mangino, Pearletha Furl, MD  PAST MEDICAL HISTORY: Past Medical History:  Diagnosis Date   Depression    Headache    Hearing loss    Memory loss    Multiple sclerosis (HCC)    Vision abnormalities     PAST SURGICAL HISTORY: Past Surgical History:  Procedure Laterality Date   COLONOSCOPY WITH PROPOFOL N/A 07/01/2015   Procedure: COLONOSCOPY WITH PROPOFOL;  Surgeon: Christena Deem, MD;  Location: [facility] ENDOSCOPY;  Service: Endoscopy;  Laterality: N/A;   varicose seals      FAMILY HISTORY: Family History  Problem Relation Age of Onset   Lung cancer Mother    Heart disease Father    Prostate cancer Father    Diabetes type II Father     SOCIAL HISTORY:  Social History   Socioeconomic History   Marital status: Married    Spouse name: Not on file   Number of children: Not on file   Years of education: Not on file   Highest education level: Not on file  Occupational History   Not on file  Social Needs   Financial resource strain: Not on file   Food insecurity    Worry: Not on file    Inability: Not on file   Transportation needs    Medical: Not on file    Non-medical: Not on file  Tobacco Use   Smoking status: Never Smoker   Smokeless tobacco: Never Used  Substance and Sexual Activity   Alcohol use: No   Drug use: No   Sexual activity: Not on file  Lifestyle   Physical activity    Days per week: Not on file    Minutes per session: Not on file   Stress: Not on file  Relationships   Social connections    Talks on phone: Not on file    Gets together: Not on file    Attends religious service: Not on file    Active member of club or organization: Not on file    Attends meetings of clubs or organizations: Not on file    Relationship status: Not on file   Intimate partner violence    Fear of current or ex partner: Not on file    Emotionally abused: Not on file    Physically abused: Not on file     Forced sexual activity: Not on file  Other Topics Concern   Not on file  Social History Narrative   Not on file     PHYSICAL EXAM  Vitals:   07/31/19 1059  BP: 121/70  Pulse: 79  Temp: 98.3 F (36.8 C)  SpO2: 98%  Weight: 267 lb 12.8 oz (121.5 kg)  Height: 5\' 11"  (1.803 m)    Body mass index is 37.35 kg/m.   General:   Patient is well-developed and well-nourished and in no acute distress   Extremities: He has mild tenderness to deep palpation at the Right wrist and has a Tinel sign at the left wrist.  Neurologic Exam  Mental status: The patient is alert and oriented . The patient has apparent normal recent and remote memory, with mildly reduced attention span and concentration ability.   Speech is normal.  Cranial nerves: Extraocular muscles are normal.     Motor:  Muscle bulk is normal.   Tone is increased in legs. Strength is  5 / 5 in arms and proximally in legs. Strength was 4+/5 distally in the legs and 4/5 in the APB muscle on the left   Sensory:   On sensory testing, there is numbness in the left hand over the thenar eminence  Coordination: Finger-nose-finger is performed well.  Gait and station: Station is normal.  His gait is wide and mildly spastic..  The tandem gait is poor.  Deep tendon reflexes: Normal in the arms and slightly increased at the knees.    DIAGNOSTIC DATA (LABS, IMAGING, TESTING) - I reviewed patient records, labs, notes, testing and imaging myself where available.      ASSESSMENT AND PLAN  Multiple sclerosis (HCC) - Plan: IgG, IgA, IgM, CBC with Differential/Platelet  High risk medication use - Plan: IgG, IgA, IgM, CBC with Differential/Platelet  Other fatigue  Gait disturbance  Detrusor muscle hypertonia  Carpal tunnel syndrome on left   1.   continue Ocrevus.  His next dose will be later this month..  We will check IgG/IgM and CBC with differential 2.    Renew medications. 3.    Stay active exercise as  tolerated. 4.    If carpal tunnel symptoms worsen he may need surgery. 5.    [place]  I gave him the name of an MS specialist in that city (Dr. Jacelyn PiBoster) he may also want to [facility].   6.    He could try Lipoflavonoid for the tinnitus.  7.    Return as needed as needed if there are new or worsening neurologic symptoms.  Luetta Piazza A. Epimenio FootSater, MD, PhD, FAAN Certified in Neurology, Clinical Neurophysiology, Sleep Medicine, Pain Medicine and Neuroimaging Director, [facility]  [facility] [address], [address] [place], [place] [address] [phone]

## 2019-08-01 ENCOUNTER — Telehealth: Payer: Self-pay | Admitting: *Deleted

## 2019-08-01 LAB — CBC WITH DIFFERENTIAL/PLATELET
Basophils Absolute: 0.1 10*3/uL (ref 0.0–0.2)
Basos: 3 %
EOS (ABSOLUTE): 0.4 10*3/uL (ref 0.0–0.4)
Eos: 8 %
Hematocrit: 46.4 % (ref 37.5–51.0)
Hemoglobin: 15.8 g/dL (ref 13.0–17.7)
Immature Grans (Abs): 0 10*3/uL (ref 0.0–0.1)
Immature Granulocytes: 0 %
Lymphocytes Absolute: 1.5 10*3/uL (ref 0.7–3.1)
Lymphs: 28 %
MCH: 31.1 pg (ref 26.6–33.0)
MCHC: 34.1 g/dL (ref 31.5–35.7)
MCV: 91 fL (ref 79–97)
Monocytes Absolute: 0.5 10*3/uL (ref 0.1–0.9)
Monocytes: 10 %
Neutrophils Absolute: 2.7 10*3/uL (ref 1.4–7.0)
Neutrophils: 51 %
Platelets: 286 10*3/uL (ref 150–450)
RBC: 5.08 x10E6/uL (ref 4.14–5.80)
RDW: 12.2 % (ref 11.6–15.4)
WBC: 5.2 10*3/uL (ref 3.4–10.8)

## 2019-08-01 LAB — IGG, IGA, IGM
IgA/Immunoglobulin A, Serum: 368 mg/dL (ref 90–386)
IgG (Immunoglobin G), Serum: 936 mg/dL (ref 603–1613)
IgM (Immunoglobulin M), Srm: 106 mg/dL (ref 20–172)

## 2019-08-01 NOTE — Telephone Encounter (Signed)
Called and spoke with pt. Relayed labs were fine per Dr. Felecia Shelling. He verbalized understanding. Reminded him of appt tomorrow with infusion suite at 9am.

## 2019-08-01 NOTE — Telephone Encounter (Signed)
-----   Message from Britt Bottom, MD sent at 08/01/2019 11:36 AM EDT ----- Please let the patient know that the lab work is fine.

## 2019-10-24 ENCOUNTER — Telehealth: Payer: Self-pay

## 2019-10-24 MED ORDER — GABAPENTIN 600 MG PO TABS: 600.0000 mg | ORAL_TABLET | Freq: Three times a day (TID) | ORAL | 1 refills | Status: AC

## 2019-10-24 NOTE — Telephone Encounter (Signed)
Called pt back. They recently moved to [place] and he is in the process of establishing with a new doctor there. Requesting refills for gabapentin. Advised we will send refill for 6 months to give him time to get established. Cx appt for 07/10/20 since pt moved.

## 2019-10-24 NOTE — Telephone Encounter (Signed)
1) Medication(s) Requested (by name): gabapentin (NEURONTIN) 600 MG tablet   2) Pharmacy of Choice: [facility] [address] [place].

## 2019-11-30 ENCOUNTER — Telehealth: Payer: Self-pay | Admitting: Neurology

## 2019-11-30 ENCOUNTER — Other Ambulatory Visit: Payer: Self-pay | Admitting: Neurology

## 2019-11-30 ENCOUNTER — Other Ambulatory Visit: Payer: Self-pay | Admitting: *Deleted

## 2019-11-30 MED ORDER — AMANTADINE HCL 100 MG PO CAPS: 100.0000 mg | ORAL_CAPSULE | Freq: Two times a day (BID) | ORAL | 1 refills | Status: AC

## 2019-11-30 NOTE — Telephone Encounter (Signed)
[facility] called leaving a VM in regards to the refill requesting for amantadine (SYMMETREL) 100 MG capsule stating that they had never gotten a requesting in October for a refill (seems it was sent to [facility]). Stating there were wanting to make sure the pt can get medication and MD can send a new rx.

## 2019-11-30 NOTE — Telephone Encounter (Signed)
E-scribed rx as requested. 

## 2020-02-13 ENCOUNTER — Other Ambulatory Visit: Payer: Self-pay | Admitting: Neurology

## 2020-04-19 ENCOUNTER — Other Ambulatory Visit: Payer: Self-pay | Admitting: Neurology

## 2020-07-10 ENCOUNTER — Ambulatory Visit: Payer: Medicare HMO | Admitting: Neurology
